# Patient Record
Sex: Female | Born: 1955 | Race: White | Hispanic: No | Marital: Married | Smoking: Former smoker
Health system: Southern US, Community
[De-identification: ages and names within clinical notes are randomized; demographics above are authoritative.]

## PROBLEM LIST (undated history)

## (undated) DIAGNOSIS — C50919 Malignant neoplasm of unspecified site of unspecified female breast: Secondary | ICD-10-CM

## (undated) DIAGNOSIS — D509 Iron deficiency anemia, unspecified: Secondary | ICD-10-CM

## (undated) DIAGNOSIS — K219 Gastro-esophageal reflux disease without esophagitis: Secondary | ICD-10-CM

## (undated) DIAGNOSIS — M898X9 Other specified disorders of bone, unspecified site: Secondary | ICD-10-CM

## (undated) DIAGNOSIS — R011 Cardiac murmur, unspecified: Secondary | ICD-10-CM

## (undated) DIAGNOSIS — M26629 Arthralgia of temporomandibular joint, unspecified side: Secondary | ICD-10-CM

## (undated) DIAGNOSIS — E039 Hypothyroidism, unspecified: Secondary | ICD-10-CM

## (undated) DIAGNOSIS — D259 Leiomyoma of uterus, unspecified: Secondary | ICD-10-CM

## (undated) DIAGNOSIS — F329 Major depressive disorder, single episode, unspecified: Secondary | ICD-10-CM

## (undated) DIAGNOSIS — F419 Anxiety disorder, unspecified: Secondary | ICD-10-CM

## (undated) DIAGNOSIS — L659 Nonscarring hair loss, unspecified: Secondary | ICD-10-CM

## (undated) DIAGNOSIS — Z9289 Personal history of other medical treatment: Secondary | ICD-10-CM

## (undated) DIAGNOSIS — K589 Irritable bowel syndrome without diarrhea: Secondary | ICD-10-CM

## (undated) DIAGNOSIS — E7800 Pure hypercholesterolemia, unspecified: Secondary | ICD-10-CM

## (undated) HISTORY — PX: TONSILLECTOMY AND ADENOIDECTOMY: SUR1326

## (undated) HISTORY — PX: ESOPHAGOGASTRODUODENOSCOPY: SHX1529

## (undated) HISTORY — DX: Gastro-esophageal reflux disease without esophagitis: K21.9

## (undated) HISTORY — DX: Iron deficiency anemia, unspecified: D50.9

## (undated) HISTORY — DX: Pure hypercholesterolemia, unspecified: E78.00

## (undated) HISTORY — DX: Irritable bowel syndrome, unspecified: K58.9

## (undated) HISTORY — DX: Cardiac murmur, unspecified: R01.1

## (undated) HISTORY — PX: OTHER SURGICAL HISTORY: SHX169

## (undated) HISTORY — DX: Arthralgia of temporomandibular joint, unspecified side: M26.629

## (undated) HISTORY — DX: Major depressive disorder, single episode, unspecified: F32.9

## (undated) HISTORY — PX: MOUTH SURGERY: SHX715

## (undated) HISTORY — PX: GYNECOLOGIC CRYOSURGERY: SHX857

## (undated) HISTORY — DX: Personal history of other medical treatment: Z92.89

## (undated) HISTORY — PX: PORTACATH PLACEMENT: SHX2246

## (undated) HISTORY — DX: Anxiety disorder, unspecified: F41.9

## (undated) HISTORY — DX: Leiomyoma of uterus, unspecified: D25.9

## (undated) HISTORY — PX: MASTECTOMY: SHX3

## (undated) HISTORY — DX: Hypothyroidism, unspecified: E03.9

## (undated) HISTORY — DX: Nonscarring hair loss, unspecified: L65.9

## (undated) HISTORY — DX: Other specified disorders of bone, unspecified site: M89.8X9

---

## 1998-04-03 ENCOUNTER — Other Ambulatory Visit: Admission: RE | Admit: 1998-04-03 | Discharge: 1998-04-03 | Payer: Self-pay | Admitting: Obstetrics and Gynecology

## 1998-07-26 ENCOUNTER — Other Ambulatory Visit: Admission: RE | Admit: 1998-07-26 | Discharge: 1998-07-26 | Payer: Self-pay | Admitting: Obstetrics and Gynecology

## 1998-10-10 ENCOUNTER — Ambulatory Visit (HOSPITAL_COMMUNITY): Admission: RE | Admit: 1998-10-10 | Discharge: 1998-10-10 | Payer: Self-pay | Admitting: Gastroenterology

## 1999-05-02 ENCOUNTER — Other Ambulatory Visit: Admission: RE | Admit: 1999-05-02 | Discharge: 1999-05-02 | Payer: Self-pay | Admitting: Obstetrics and Gynecology

## 1999-10-16 ENCOUNTER — Other Ambulatory Visit: Admission: RE | Admit: 1999-10-16 | Discharge: 1999-10-16 | Payer: Self-pay | Admitting: Obstetrics and Gynecology

## 1999-11-26 ENCOUNTER — Ambulatory Visit (HOSPITAL_BASED_OUTPATIENT_CLINIC_OR_DEPARTMENT_OTHER): Admission: RE | Admit: 1999-11-26 | Discharge: 1999-11-26 | Payer: Self-pay | Admitting: Orthopedic Surgery

## 2000-05-06 ENCOUNTER — Other Ambulatory Visit: Admission: RE | Admit: 2000-05-06 | Discharge: 2000-05-06 | Payer: Self-pay | Admitting: Obstetrics and Gynecology

## 2000-11-30 ENCOUNTER — Other Ambulatory Visit: Admission: RE | Admit: 2000-11-30 | Discharge: 2000-11-30 | Payer: Self-pay | Admitting: Obstetrics and Gynecology

## 2001-03-02 ENCOUNTER — Other Ambulatory Visit: Admission: RE | Admit: 2001-03-02 | Discharge: 2001-03-02 | Payer: Self-pay | Admitting: Obstetrics and Gynecology

## 2002-09-29 ENCOUNTER — Other Ambulatory Visit: Admission: RE | Admit: 2002-09-29 | Discharge: 2002-09-29 | Payer: Self-pay | Admitting: Obstetrics and Gynecology

## 2004-11-04 ENCOUNTER — Other Ambulatory Visit: Admission: RE | Admit: 2004-11-04 | Discharge: 2004-11-04 | Payer: Self-pay | Admitting: Obstetrics and Gynecology

## 2005-12-26 ENCOUNTER — Other Ambulatory Visit: Admission: RE | Admit: 2005-12-26 | Discharge: 2005-12-26 | Payer: Self-pay | Admitting: Obstetrics and Gynecology

## 2007-03-03 ENCOUNTER — Other Ambulatory Visit: Admission: RE | Admit: 2007-03-03 | Discharge: 2007-03-03 | Payer: Self-pay | Admitting: Obstetrics and Gynecology

## 2008-01-02 ENCOUNTER — Encounter: Admission: RE | Admit: 2008-01-02 | Discharge: 2008-01-02 | Payer: Self-pay | Admitting: Orthopedic Surgery

## 2008-01-17 ENCOUNTER — Ambulatory Visit (HOSPITAL_BASED_OUTPATIENT_CLINIC_OR_DEPARTMENT_OTHER): Admission: RE | Admit: 2008-01-17 | Discharge: 2008-01-17 | Payer: Self-pay | Admitting: Orthopedic Surgery

## 2008-01-27 ENCOUNTER — Encounter: Admission: RE | Admit: 2008-01-27 | Discharge: 2008-01-27 | Payer: Self-pay | Admitting: Orthopedic Surgery

## 2008-01-28 ENCOUNTER — Encounter: Admission: RE | Admit: 2008-01-28 | Discharge: 2008-01-28 | Payer: Self-pay | Admitting: Orthopedic Surgery

## 2009-09-16 IMAGING — CT CT CHEST W/ CM
2 of 3 series · 15 of 36 positions shown, 18 images · IV contrast (75CC OMNI 300)
Comparison: Plain radiograph from earlier today

CLINICAL DATA: Abnormal chest x-ray

CT CHEST WITH CONTRAST
TECHNIQUE: Multidetector CT imaging of the chest was performed
following the standard protocol during bolus administration of
intravenous contrast.
Contrast: 75 ml Tmnipaque-DQQ

[Series 2: routine chest · axial · 0.70mm/px · z∈[-280,+5]mm · 12 of 67 slices shown, 15 images]
[im 5/67  mediastinal]
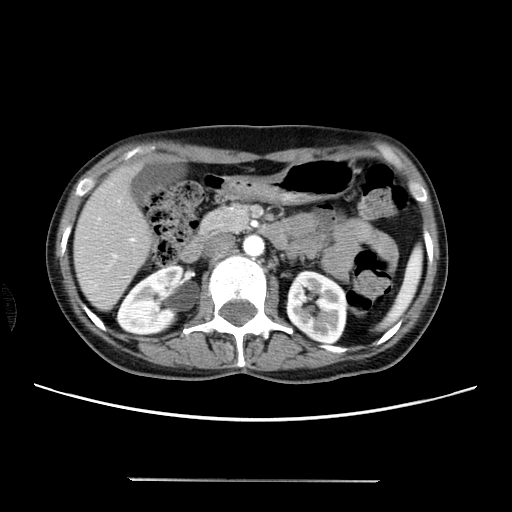
[im 5/67  lung]
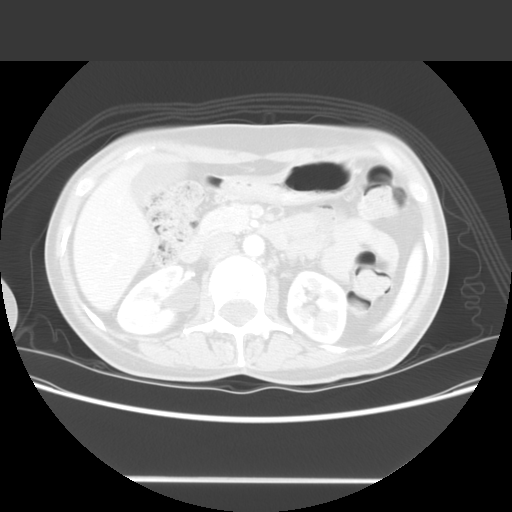
[im 10/67  lung]
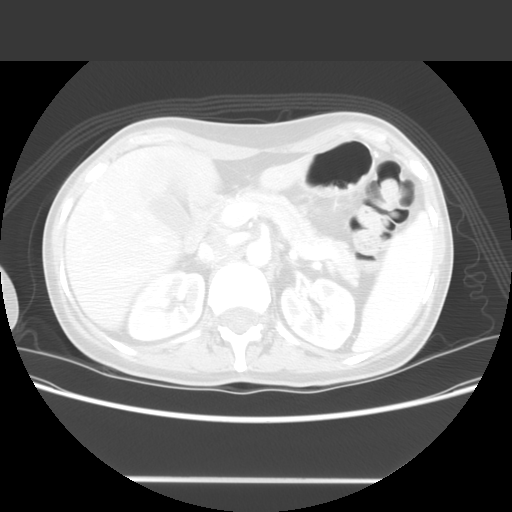
[im 15/67  lung]
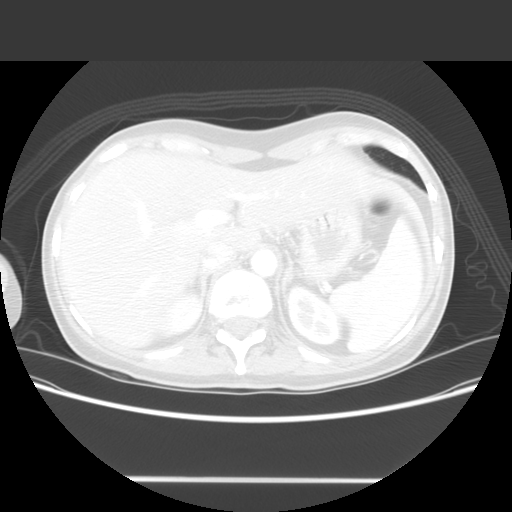
[im 20/67  lung]
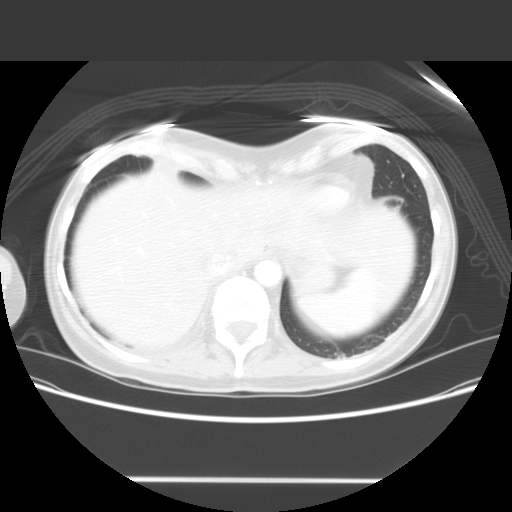
[im 25/67  mediastinal]
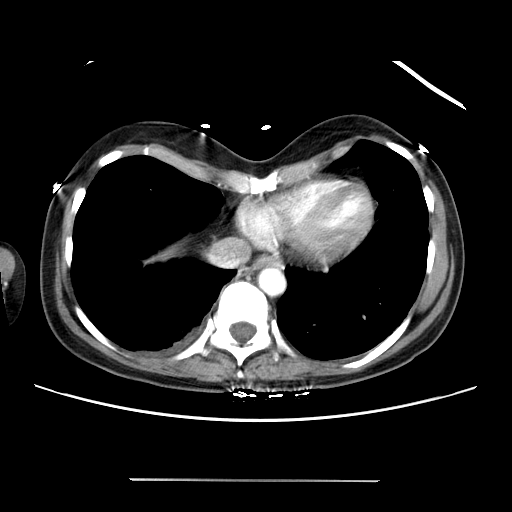
[im 25/67  lung]
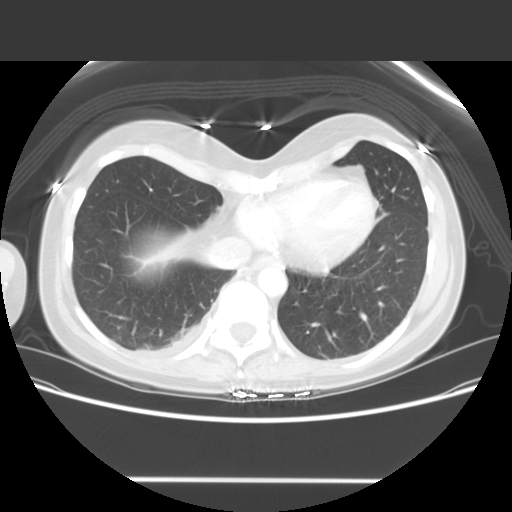
[im 30/67  lung]
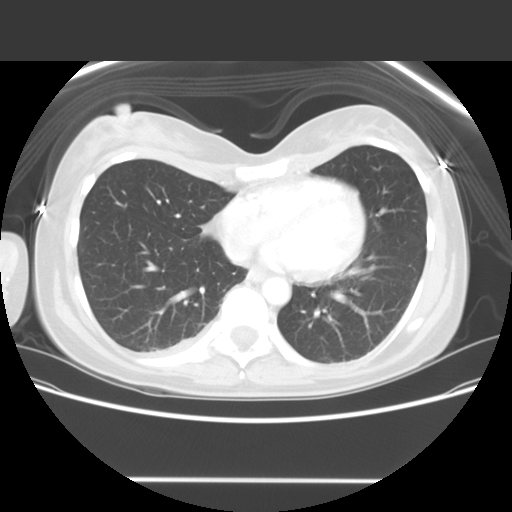
[im 37/67  lung]
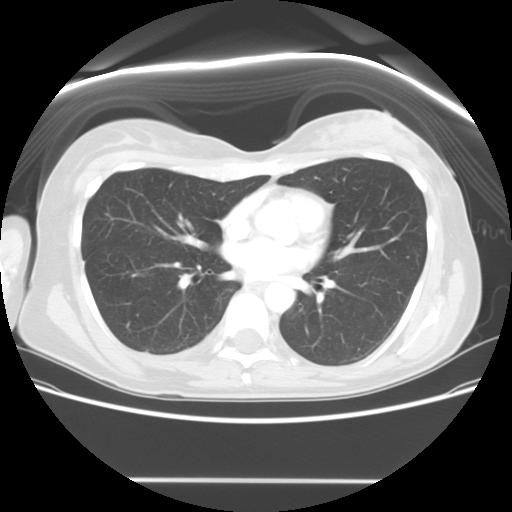
[im 42/67  lung]
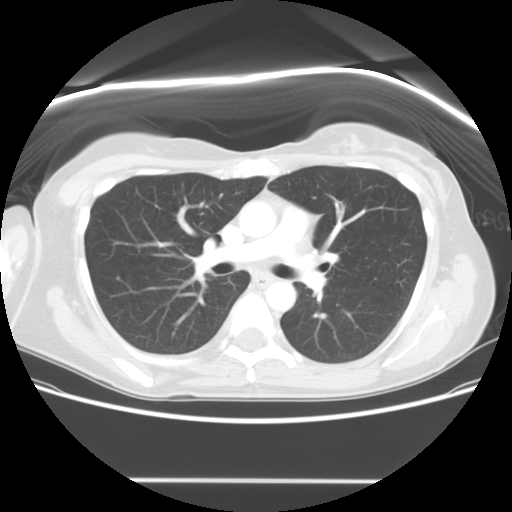
[im 47/67  mediastinal]
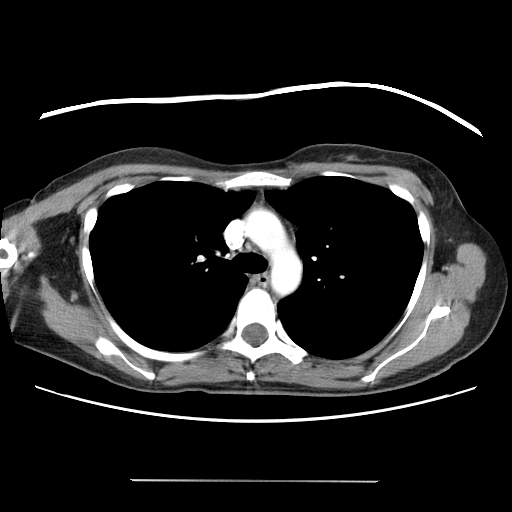
[im 47/67  lung]
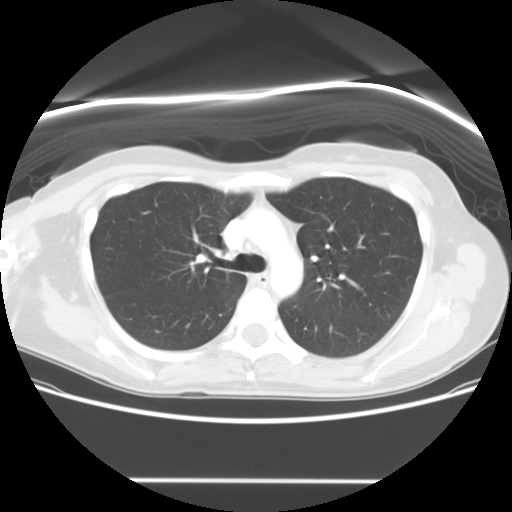
[im 52/67  lung]
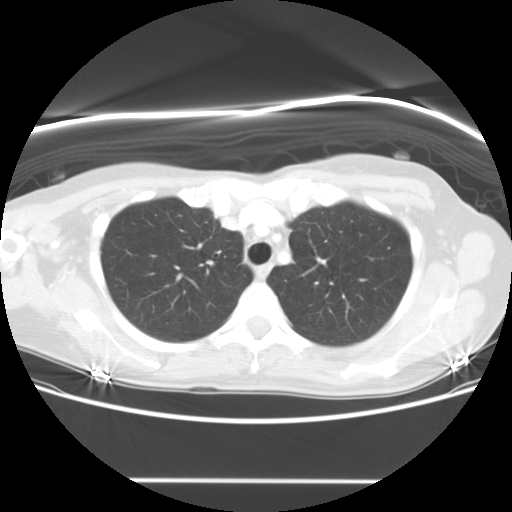
[im 57/67  lung]
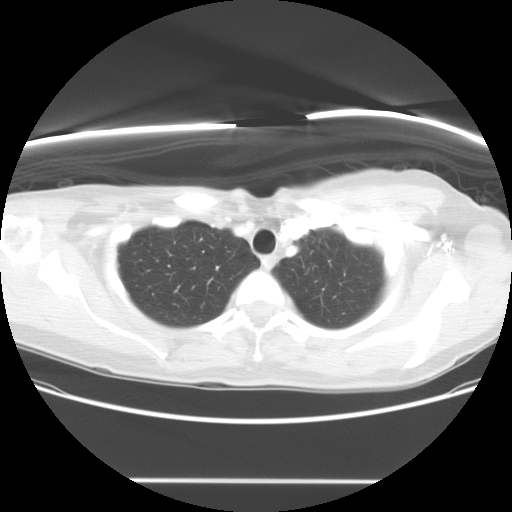
[im 62/67  lung]
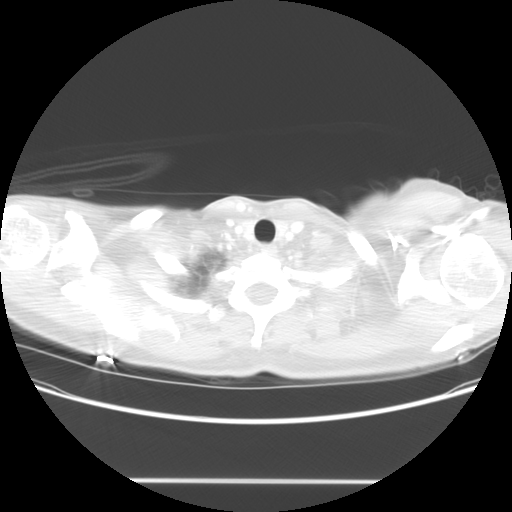

[Series 401: sagittal · coronal · 0.70mm/px · 3 of 86 slices shown]
[im 18/86  lung]
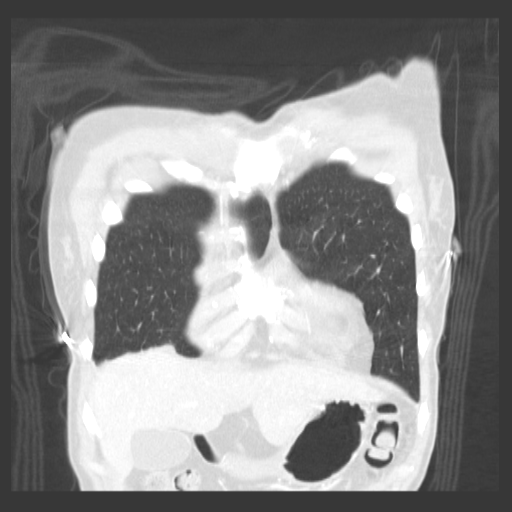
[im 35/86  lung]
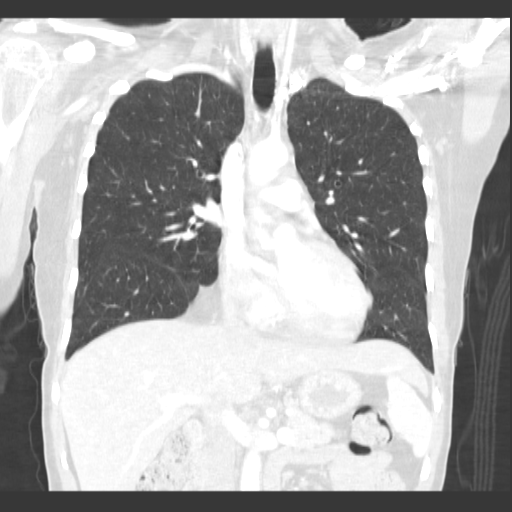
[im 52/86  lung]
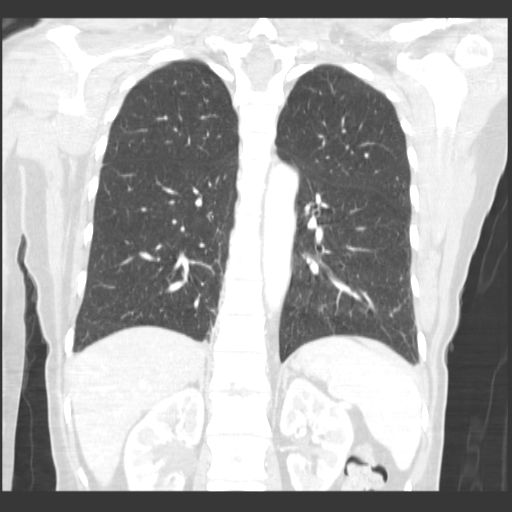

[15 of 36 positions shown; findings below may reference images not displayed]

FINDINGS: No abnormal pulmonary nodules are identified.  A
prominent nipple accounts for the radiographic abnormality.

There is a 3 mm pulmonary nodule in the right upper lobe on image
26.  Lungs are otherwise clear.

A small right pleural effusion is present.

Negative abnormal mediastinal adenopathy or pericardial effusion.
Pectus deformity is noted. Multiple hypodensities in both kidneys
are noted likely cysts.
IMPRESSION: No pulmonary nodule is identified.  Prominent nipple account for
the radiographic abnormality.

Small right pleural effusion.

3 mm right upper lobe pulmonary nodule.  A low risk patient, no
further workup is necessary.  In a high risk patient, follow-up CT
at 12 months is recommended. This recommendation follows the
consensus statement: "Guidelines for Management of Small Pulmonary
Nodules Detected on CT Scans:  A Statement from the [facility]
online at:  [URL]

## 2009-09-16 IMAGING — CR DG CHEST 2V
2 series · 2 of 2 positions shown · non-contrast
Comparison: None

CLINICAL DATA: Pneumonia

CHEST - 2 VIEW

[view not recorded (1 of 2)]
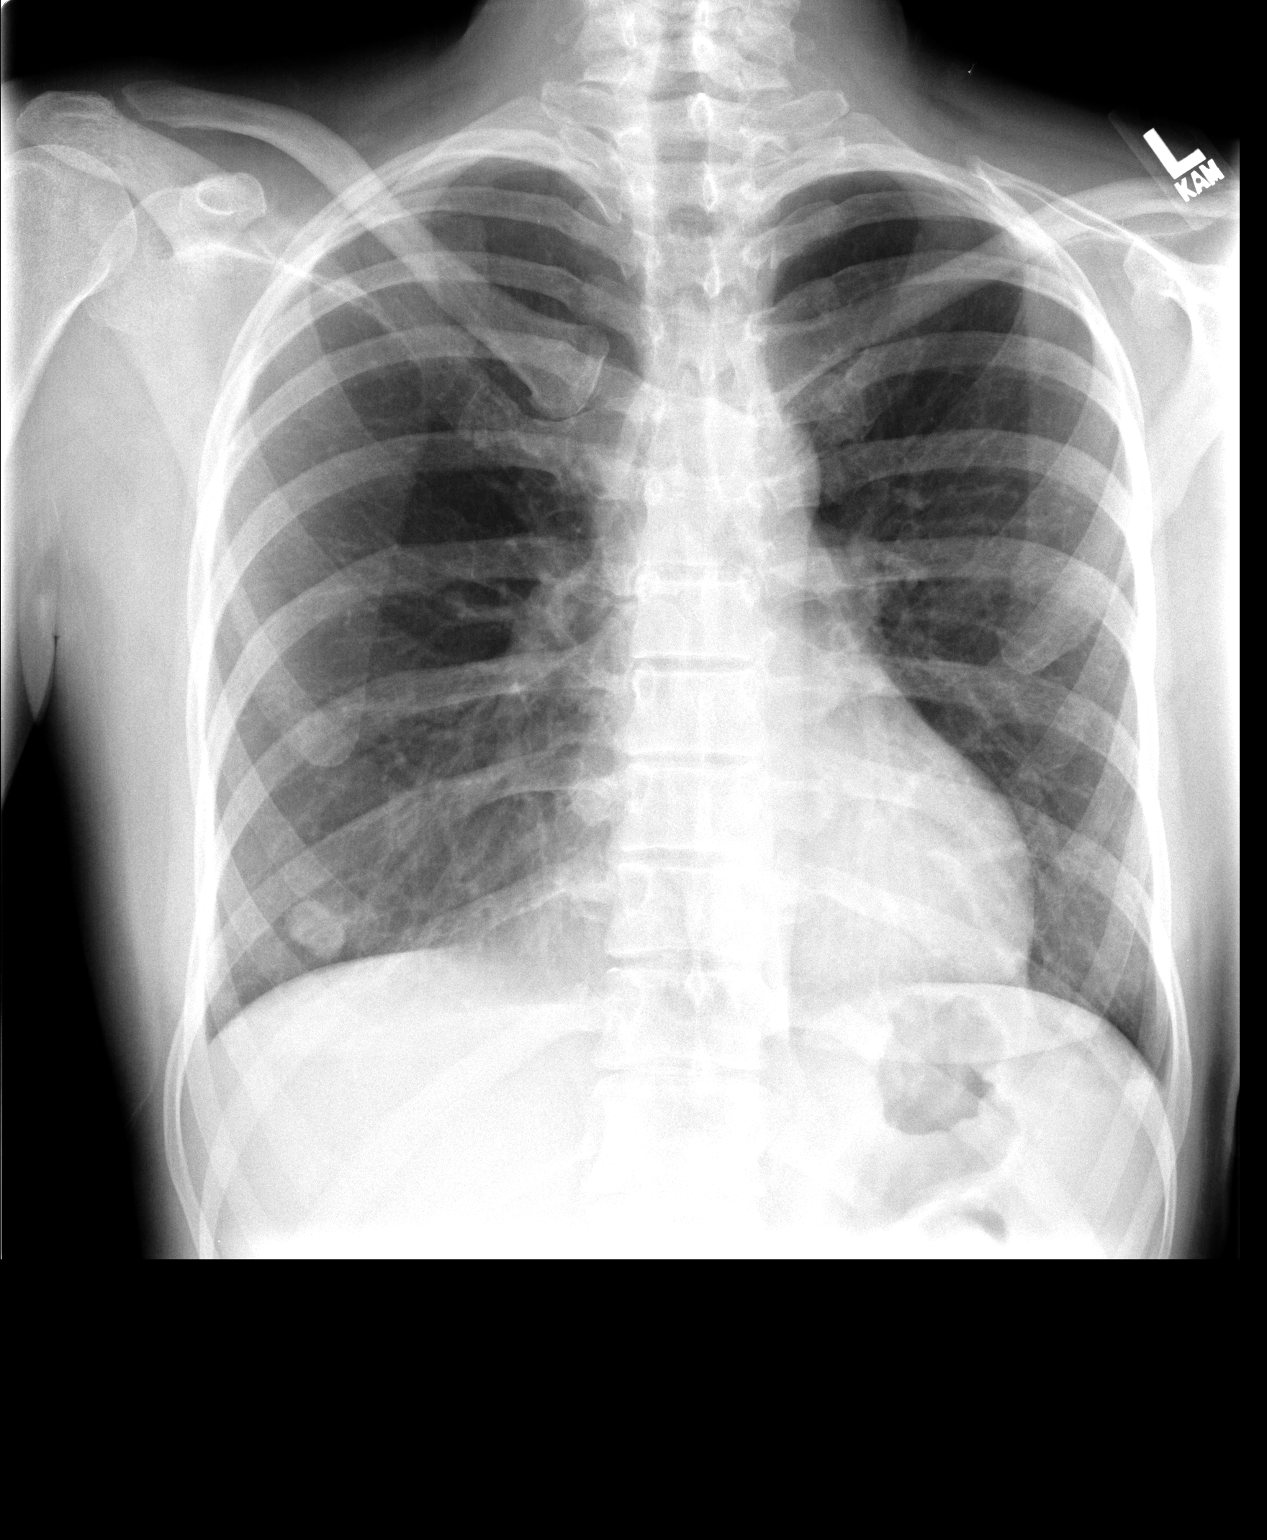

[view not recorded (2 of 2)]
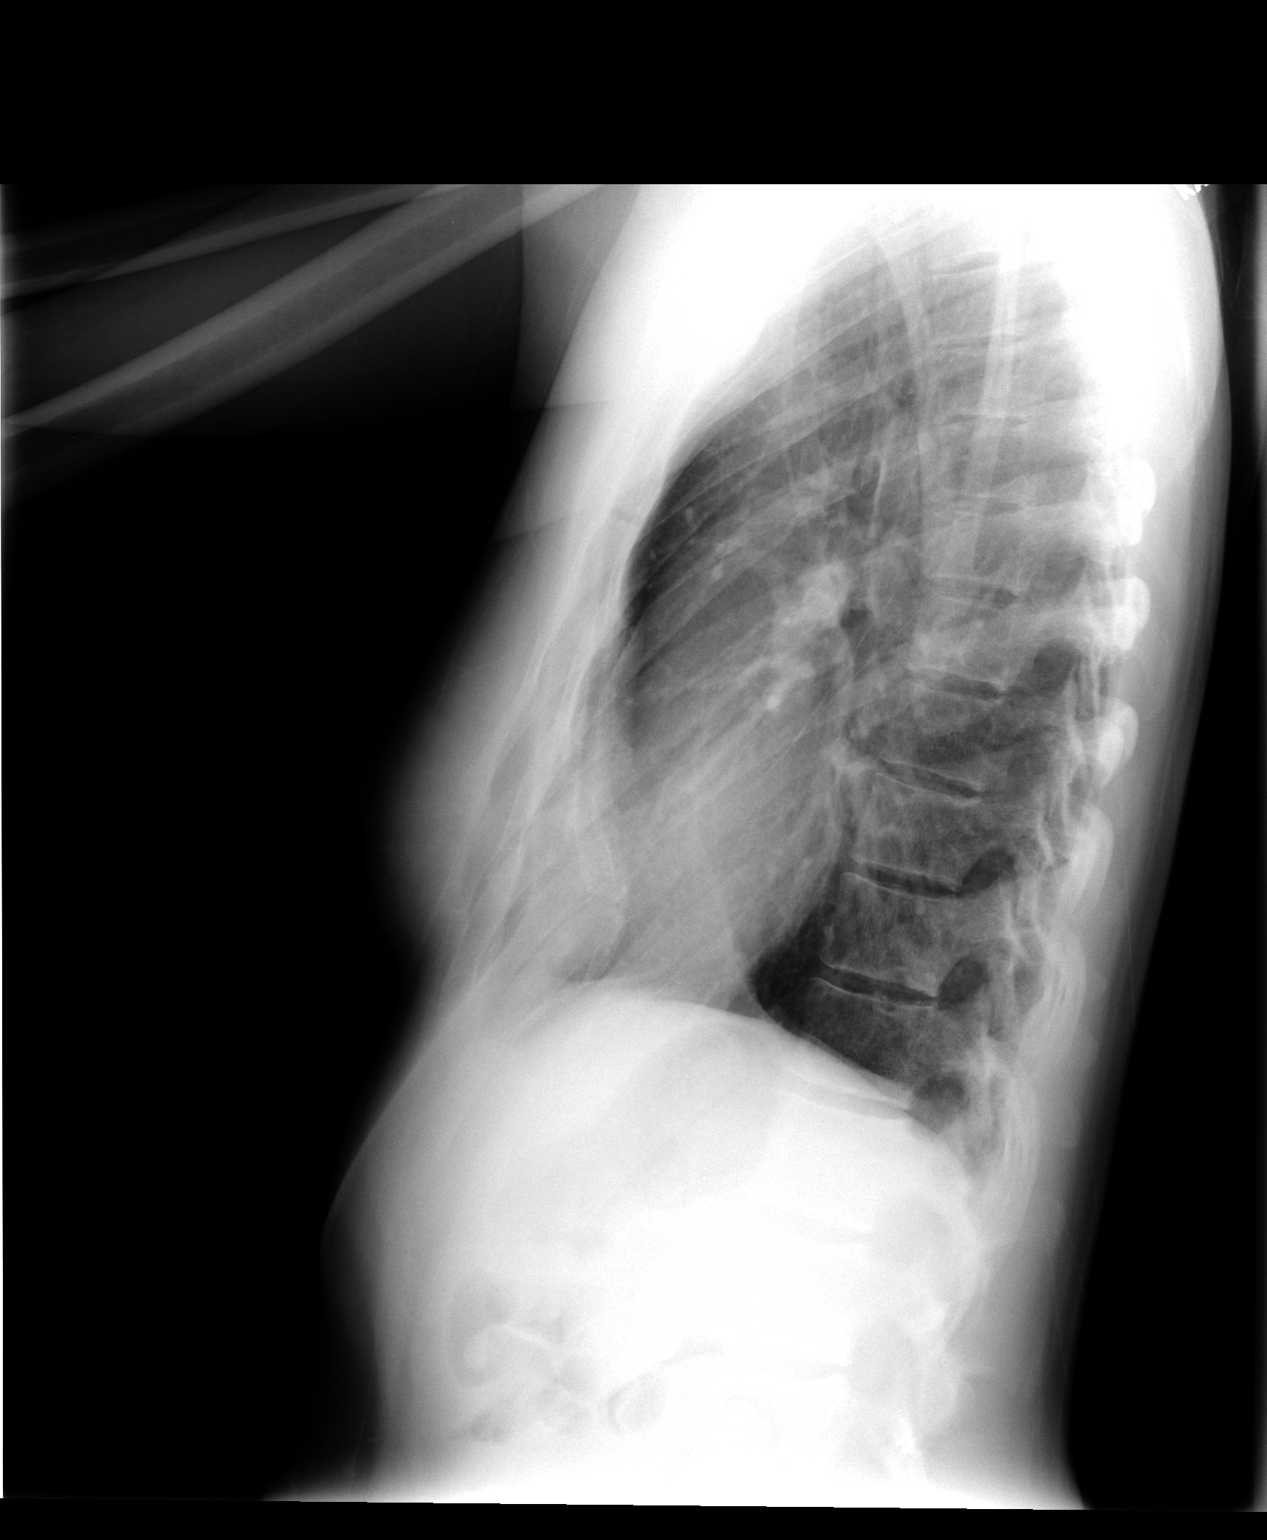

[2 of 2 positions shown; findings below may reference images not displayed]

FINDINGS: There is a dense 1.5 cm nodule at the right lung base.
Lungs are otherwise clear.  No pneumothorax or effusion.  Mild
pectus excavatum no pneumothorax or effusion.
IMPRESSION: Dense nodule at the right lung base.  CT is recommended to further
characterize.

## 2009-10-02 ENCOUNTER — Emergency Department (HOSPITAL_COMMUNITY): Admission: EM | Admit: 2009-10-02 | Discharge: 2009-10-04 | Payer: Self-pay | Admitting: Emergency Medicine

## 2009-12-24 ENCOUNTER — Ambulatory Visit: Payer: Self-pay | Admitting: Psychiatry

## 2010-05-10 LAB — CBC
Hemoglobin: 13.1 g/dL (ref 12.0–15.0)
MCH: 28.2 pg (ref 26.0–34.0)
MCHC: 33.4 g/dL (ref 30.0–36.0)
Platelets: 340 10*3/uL (ref 150–400)
RDW: 16 % — ABNORMAL HIGH (ref 11.5–15.5)

## 2010-05-10 LAB — URINALYSIS, ROUTINE W REFLEX MICROSCOPIC
Ketones, ur: NEGATIVE mg/dL
Nitrite: NEGATIVE
Urobilinogen, UA: 0.2 mg/dL (ref 0.0–1.0)
pH: 6.5 (ref 5.0–8.0)

## 2010-05-10 LAB — BASIC METABOLIC PANEL
BUN: 7 mg/dL (ref 6–23)
CO2: 27 mEq/L (ref 19–32)
Calcium: 9.8 mg/dL (ref 8.4–10.5)
Creatinine, Ser: 0.82 mg/dL (ref 0.4–1.2)
GFR calc non Af Amer: >60 mL/min (ref >60)
Glucose, Bld: 117 mg/dL — ABNORMAL HIGH (ref 70–99)
Sodium: 141 mEq/L (ref 135–145)

## 2010-05-10 LAB — DIFFERENTIAL
Basophils Absolute: 0 10*3/uL (ref 0.0–0.1)
Basophils Relative: 0 % (ref 0–1)
Eosinophils Absolute: 0 10*3/uL (ref 0.0–0.7)
Neutro Abs: 5.8 10*3/uL (ref 1.7–7.7)
Neutrophils Relative %: 75 % (ref 43–77)

## 2010-05-10 LAB — RAPID URINE DRUG SCREEN, HOSP PERFORMED
Amphetamines: NOT DETECTED
Barbiturates: NOT DETECTED

## 2010-05-10 LAB — PREGNANCY, URINE: Preg Test, Ur: NEGATIVE

## 2010-05-10 LAB — TRICYCLICS SCREEN, URINE: TCA Scrn: NOT DETECTED

## 2010-05-14 ENCOUNTER — Other Ambulatory Visit: Payer: Self-pay | Admitting: Unknown Physician Specialty

## 2010-05-14 DIAGNOSIS — Z1231 Encounter for screening mammogram for malignant neoplasm of breast: Secondary | ICD-10-CM

## 2010-05-14 DIAGNOSIS — Z780 Asymptomatic menopausal state: Secondary | ICD-10-CM

## 2010-05-24 ENCOUNTER — Ambulatory Visit
Admission: RE | Admit: 2010-05-24 | Discharge: 2010-05-24 | Disposition: A | Discharge status: Home / Self Care | Payer: BC Managed Care – PPO | Source: Ambulatory Visit | Attending: Unknown Physician Specialty | Admitting: Unknown Physician Specialty

## 2010-05-24 DIAGNOSIS — Z780 Asymptomatic menopausal state: Secondary | ICD-10-CM

## 2010-05-24 DIAGNOSIS — Z1231 Encounter for screening mammogram for malignant neoplasm of breast: Secondary | ICD-10-CM

## 2010-05-27 ENCOUNTER — Other Ambulatory Visit: Payer: Self-pay | Admitting: Unknown Physician Specialty

## 2010-05-27 DIAGNOSIS — N6459 Other signs and symptoms in breast: Secondary | ICD-10-CM

## 2010-07-09 NOTE — Op Note (Signed)
[name], [name]               ACCOUNT NO.:  [id number]   MEDICAL RECORD NO.:  [id number]          PATIENT TYPE:  AMB   LOCATION:  DSC                          FACILITY:  [facility]   PHYSICIAN:  Robert A. Thurston Hole, M.D. DATE OF BIRTH:  07-23-1955   DATE OF PROCEDURE:  01/17/2008  DATE OF DISCHARGE:                               OPERATIVE REPORT   PREOPERATIVE DIAGNOSES:  1. Right shoulder adhesive capsulitis.  2. Right shoulder partial rotator cuff tear.  3. Right shoulder impingement.  4. Right shoulder acromioclavicular joint degenerative joint disease      and spurring.   POSTOPERATIVE DIAGNOSES:  1. Right shoulder adhesive capsulitis.  2. Right shoulder partial rotator cuff tear.  3. Right shoulder impingement.  4. Right shoulder acromioclavicular joint degenerative joint disease      and spurring.   PROCEDURES:  1. Right shoulder examination under anesthesia followed by      manipulation.  2. Right shoulder arthroscopic lysis of adhesions.  3. Right shoulder arthroscopic debridement of partial rotator cuff      tear with subacromial decompression.  4. Right shoulder distal clavicle excision.   SURGEON:  Elana Alm. Thurston Hole, MD   ASSISTANT:  Julien Girt, PA   ANESTHESIA:  General.   OPERATIVE TIME:  45 minutes.   COMPLICATIONS:  None.   INDICATIONS FOR PROCEDURE:  Laurie [name] is a 55 year old who has had  significant pain and decreasing range of motion in her right shoulder  for the past 3-4 months, increasing in nature.  Exam and MRIs were  consistent with rotator cuff partial tear with adhesive capsulitis and  impingement and AC joint arthropathy.  She has failed conservative care  and is now to undergo EUA, manipulation, arthroscopic lysis of  adhesions, and debridement.   DESCRIPTION:  Laurie [name] was brought to the operating room on January 17, 2008, after interscalene block was placed in the holding by  Anesthesia.  She was placed on the operating  table in supine position.  The right shoulder was examined under anesthesia.  Initial range of  motion showed forward flexion of 90, abduction of 90, and internal and  external rotation of 50 degrees.  A gentle manipulation was carried out  breaking up adhesions and improving forward flexion of 175, abduction of  175, and internal and external rotation of 85 degrees.  The shoulder  remained stable to ligamentous exam.  She was then placed on beach chair  position, and her shoulder and arm were prepped using sterile DuraPrep  and draped using sterile technique.  Originally, the arthroscopy was  performed to a posterior arthroscopic portal.  The arthroscope with a  pump attached was placed into an anterior portal.  Arthroscopic probe  was placed.  Moderate hemarthrosis secondary to the manipulation was  evacuated.  The articular surfaces on the glenoid and humeral head were  intact.  The anterior, superior, and posterior labrum were all intact,  but were significantly erythematous secondary to erythematous synovitis.  This was partially debrided and cauterized.  The anteroinferior  glenohumeral ligament complex was intact.  Biceps tendon and  anchor of  biceps tendon was intact.  Rotator cuff showed a small partial tear of  the supraspinatus 20%, which was debrided.  The rest of the rotator cuff  was intact.  The inferior cuff was reassessed, showed moderately  thickened synovitis, which was partially debrided and cauterized, but  was otherwise free of pathology.  Subacromial space was then entered and  lateral arthroscopic portal was made.  Large amount of bursitis  inflamed, was thoroughly resected and cauterized.  The rotator cuff was  somewhat frayed on the bursal surface, but no evidence of a tear.  Impingement was noted and the subacromial decompression was carried out  removing 6 mm of under surface of the anterior, anterolateral,  anteromedial, acromion, and CA ligament release  carried down as well.  The Laurie [name] joint showed significant spurring and degenerative changes and  the distal 6-mm clavicle was resected with a 6-mm bur.  After this was  done, the shoulder could be brought through a full range of motion with  no impingement on the rotator cuff.  At this point, it was felt that all  pathology have been satisfactorily addressed.  The instruments were  removed.  The portals were closed with 3-0 nylon suture.  The shoulder  was then injected with 80 mg of Depo-Medrol and 10 mL of 0.25% Marcaine  with epinephrine with half of this being put in the joint and half in  the subacromial space.  Sterile dressings and a sling were then applied.  The patient awakened and taken to the recovery room in stable condition.   FOLLOWUP CARE:  Laurie [name] will be followed as an outpatient on Nucynta  and Robaxin with early aggressive physical therapy.  She will be seen  back in the office in a week for sutures out and followup.      Robert A. Thurston Hole, M.D.  Electronically Signed     RAW/MEDQ  D:  01/17/2008  T:  01/18/2008  Job:  [id number]

## 2010-07-12 NOTE — Op Note (Signed)
[place]  Patient:    Laurie [name], Laurie [name]                      MRN: [id number] Proc. Date: 11/26/99 Adm. Date:  [id number] Disc. Date: [id number] Attending:  Twana First                           Operative Report  PREOPERATIVE DIAGNOSIS:  Left shoulder adhesive capsulitis and impingement with rotator cuff tendinitis.  POSTOPERATIVE DIAGNOSIS:  Left shoulder adhesive capsulitis and impingement with rotator cuff tendinitis.  PROCEDURE:  Left shoulder examination under anesthesia, followed by manipulation and arthroscopic lysis of adhesions with subacromial decompression.  SURGEON:  Elana Alm. Thurston Hole, M.D.  ASSISTANT:  Kirstin A. Shepperson, P.A.  ANESTHESIA:  General.  OPERATIVE TIME:  Forty minutes.  COMPLICATIONS:  None.  INDICATION FOR PROCEDURE:  Laurie [name] is a 55 year old woman who has had significant pain in Laurie [name] left shoulder over the past four to five months, with decreased range of motion and increased pain, that has failed conservative care and is now to undergo EUA, manipulation and arthroscopy.  DESCRIPTION OF PROCEDURE:  Laurie [name] was brought to the operating room on November 26, 1999 after a supraclavicular block had been placed in the holding room and placed on the operating table in supine position.  After an adequate level of general anesthesia was obtained, Laurie [name] left shoulder was examined under anesthesia.  Initial exam shows forward flexion of 90 degrees, abduction of 80 degrees and internal and external rotation of 50 degrees.  A gentle manipulation was carried out, breaking up soft adhesions and improving forward flexion to 180, abduction to 170, internal and external rotation of 90 degrees.  The shoulder remained stable to ligamentous exam.  After this was done, Laurie [name] shoulder and arm were prepped using sterile Betadine and draped using a sterile technique.  Originally, through a posterior arthroscopic portal, the  arthroscope with a pump attachment was placed and through an anterior portal, an arthroscopic probe was placed.  On initial inspection, she was found to have moderate hemorrhagic synovitis consistent with Laurie [name] adhesive capsulitis.  The anterior glenohumeral ligament complex and the anteroinferior labrum were intact.  The middle anterior labrum, superior labrum and biceps tendon anchor were intact.  The biceps tendon was intact.  The posterior labrum was intact.  Articular cartilage and the glenohumeral joint were intact.  Rotator cuff was thoroughly inspected but there was found to be no evidence of a tear.  Moderate hemorrhagic synovitis in the inferior capsular space was noted and partially debrided.  At this point, a lateral arthroscopic portal made and the subacromial space was entered.  Moderately thickened bursitis was resected.  Underneath this, the rotator cuff was found to be thickened and edematous but no evidence of a tear.  Subacromial decompression was carried out, removing 6 to 8 mm of the undersurface of the anterior, anterolateral and anteromedial acromion, and CA ligament release carried out. The [facility] joint was not disturbed.  After this was done, the shoulder could be brought through a full range of motion without impingement on the rotator cuff.  At this point, it was felt that all pathology had been satisfactorily addressed.  The arthroscopic instruments were removed.  Portals were closed with 3-0 nylon suture and injected with 0.25% Marcaine with epinephrine, sterile dressings applied and the patient awakened and taken to the recovery room in stable condition.  FOLLOWUP CARE:  Laurie [name] will be followed as an outpatient, on Percocet and Vioxx, early aggressive physical therapy for range of motion, followed by strenghtening.  I will see Laurie [name] back in the office in a week for sutures out and followup. DD:  01/07/00 TD:  01/07/00 Job: [id number]

## 2010-11-27 LAB — POCT HEMOGLOBIN-HEMACUE: Hemoglobin: 11.7 — ABNORMAL LOW

## 2014-08-01 ENCOUNTER — Emergency Department (HOSPITAL_COMMUNITY)
Admission: EM | Admit: 2014-08-01 | Discharge: 2014-08-01 | Disposition: A | Discharge status: Home / Self Care | Payer: 59 | Attending: Emergency Medicine | Admitting: Emergency Medicine

## 2014-08-01 ENCOUNTER — Encounter (HOSPITAL_COMMUNITY): Payer: Self-pay | Admitting: Physical Medicine and Rehabilitation

## 2014-08-01 DIAGNOSIS — R112 Nausea with vomiting, unspecified: Secondary | ICD-10-CM | POA: Insufficient documentation

## 2014-08-01 DIAGNOSIS — N61 Inflammatory disorders of breast: Secondary | ICD-10-CM | POA: Insufficient documentation

## 2014-08-01 DIAGNOSIS — R109 Unspecified abdominal pain: Secondary | ICD-10-CM | POA: Diagnosis present

## 2014-08-01 DIAGNOSIS — R197 Diarrhea, unspecified: Secondary | ICD-10-CM | POA: Insufficient documentation

## 2014-08-01 LAB — COMPREHENSIVE METABOLIC PANEL
ALT: 16 U/L (ref 14–54)
ANION GAP: 10 (ref 5–15)
AST: 21 U/L (ref 15–41)
Albumin: 4.3 g/dL (ref 3.5–5.0)
Alkaline Phosphatase: 64 U/L (ref 38–126)
BILIRUBIN TOTAL: 0.3 mg/dL (ref 0.3–1.2)
BUN: 5 mg/dL — AB (ref 6–20)
CO2: 26 mmol/L (ref 22–32)
CREATININE: 0.88 mg/dL (ref 0.44–1.00)
Calcium: 9.9 mg/dL (ref 8.9–10.3)
Chloride: 106 mmol/L (ref 101–111)
GFR calc Af Amer: >60 mL/min (ref >60)
GFR calc non Af Amer: >60 mL/min (ref >60)
GLUCOSE: 106 mg/dL — AB (ref 65–99)
POTASSIUM: 3.5 mmol/L (ref 3.5–5.1)
SODIUM: 142 mmol/L (ref 135–145)
TOTAL PROTEIN: 7.5 g/dL (ref 6.5–8.1)

## 2014-08-01 LAB — CBC WITH DIFFERENTIAL/PLATELET
Basophils Absolute: 0 10*3/uL (ref 0.0–0.1)
Basophils Relative: 0 % (ref 0–1)
Eosinophils Absolute: 0 10*3/uL (ref 0.0–0.7)
Eosinophils Relative: 1 % (ref 0–5)
HEMATOCRIT: 37 % (ref 36.0–46.0)
HEMOGLOBIN: 11.9 g/dL — AB (ref 12.0–15.0)
Lymphocytes Relative: 18 % (ref 12–46)
Lymphs Abs: 1.1 10*3/uL (ref 0.7–4.0)
MCH: 26.2 pg (ref 26.0–34.0)
MCHC: 32.2 g/dL (ref 30.0–36.0)
MCV: 81.3 fL (ref 78.0–100.0)
MONO ABS: 0.3 10*3/uL (ref 0.1–1.0)
Monocytes Relative: 5 % (ref 3–12)
NEUTROS PCT: 76 % (ref 43–77)
Neutro Abs: 4.7 10*3/uL (ref 1.7–7.7)
Platelets: 341 10*3/uL (ref 150–400)
RBC: 4.55 MIL/uL (ref 3.87–5.11)
RDW: 15.7 % — ABNORMAL HIGH (ref 11.5–15.5)
WBC: 6.2 10*3/uL (ref 4.0–10.5)

## 2014-08-01 LAB — LIPASE, BLOOD: LIPASE: 29 U/L (ref 22–51)

## 2014-08-01 LAB — TSH: TSH: 0.484 u[IU]/mL (ref 0.350–4.500)

## 2014-08-01 MED ORDER — SODIUM CHLORIDE 0.9 % IV BOLUS (SEPSIS)
1000.0000 mL | Freq: Once | INTRAVENOUS | Status: AC
  Administered 2014-08-01: 1000 mL via INTRAVENOUS

## 2014-08-01 MED ORDER — CEPHALEXIN 500 MG PO CAPS: 500.0000 mg | ORAL_CAPSULE | Freq: Four times a day (QID) | ORAL | Status: DC

## 2014-08-01 NOTE — ED Notes (Signed)
Pt presents to department for evaluation of abdominal pain, throat swelling and diarrhea. Ongoing for several days. 5/10 abdominal pain at the time. Pt is alert and oriented x4.

## 2014-08-01 NOTE — Discharge Instructions (Signed)
Cellulitis Cellulitis is an infection of the skin and the tissue beneath it. The infected area is usually red and tender. Cellulitis occurs most often in the arms and lower legs.  CAUSES  Cellulitis is caused by bacteria that enter the skin through cracks or cuts in the skin. The most common types of bacteria that cause cellulitis are staphylococci and streptococci. SIGNS AND SYMPTOMS   Redness and warmth.  Swelling.  Tenderness or pain.  Fever. DIAGNOSIS  Your health care provider can usually determine what is wrong based on a physical exam. Blood tests may also be done. TREATMENT  Treatment usually involves taking an antibiotic medicine. HOME CARE INSTRUCTIONS   Take your antibiotic medicine as directed by your health care provider. Finish the antibiotic even if you start to feel better.  Keep the infected arm or leg elevated to reduce swelling.  Apply a warm cloth to the affected area up to 4 times per day to relieve pain.  Take medicines only as directed by your health care provider.  Keep all follow-up visits as directed by your health care provider. SEEK MEDICAL CARE IF:   You notice red streaks coming from the infected area.  Your red area gets larger or turns dark in color.  Your bone or joint underneath the infected area becomes painful after the skin has healed.  Your infection returns in the same area or another area.  You notice a swollen bump in the infected area.  You develop new symptoms.  You have a fever. SEEK IMMEDIATE MEDICAL CARE IF:   You feel very sleepy.  You develop vomiting or diarrhea.  You have a general ill feeling (malaise) with muscle aches and pains. MAKE SURE YOU:   Understand these instructions.  Will watch your condition.  Will get help right away if you are not doing well or get worse. Document Released: 11/20/2004 Document Revised: 06/27/2013 Document Reviewed: 04/28/2011 [facility] Patient Information 2015 [place], [place].  This information is not intended to replace advice given to you by your health care provider. Make sure you discuss any questions you have with your health care provider. Abdominal Pain Many things can cause abdominal pain. Usually, abdominal pain is not caused by a disease and will improve without treatment. It can often be observed and treated at home. Your health care provider will do a physical exam and possibly order blood tests and X-rays to help determine the seriousness of your pain. However, in many cases, more time must pass before a clear cause of the pain can be found. Before that point, your health care provider may not know if you need more testing or further treatment. HOME CARE INSTRUCTIONS  Monitor your abdominal pain for any changes. The following actions may help to alleviate any discomfort you are experiencing:  Only take over-the-counter or prescription medicines as directed by your health care provider.  Do not take laxatives unless directed to do so by your health care provider.  Try a clear liquid diet (broth, tea, or water) as directed by your health care provider. Slowly move to a bland diet as tolerated. SEEK MEDICAL CARE IF:  You have unexplained abdominal pain.  You have abdominal pain associated with nausea or diarrhea.  You have pain when you urinate or have a bowel movement.  You experience abdominal pain that wakes you in the night.  You have abdominal pain that is worsened or improved by eating food.  You have abdominal pain that is worsened with eating fatty  foods.  You have a fever. SEEK IMMEDIATE MEDICAL CARE IF:   Your pain does not go away within 2 hours.  You keep throwing up (vomiting).  Your pain is felt only in portions of the abdomen, such as the right side or the left lower portion of the abdomen.  You pass bloody or black tarry stools. MAKE SURE YOU:  Understand these instructions.   Will watch your condition.   Will get help  right away if you are not doing well or get worse.  Document Released: 11/20/2004 Document Revised: 02/15/2013 Document Reviewed: 10/20/2012 [facility] Patient Information 2015 [place], [place]. This information is not intended to replace advice given to you by your health care provider. Make sure you discuss any questions you have with your health care provider. Diarrhea Diarrhea is frequent loose and watery bowel movements. It can cause you to feel weak and dehydrated. Dehydration can cause you to become tired and thirsty, have a dry mouth, and have decreased urination that often is dark yellow. Diarrhea is a sign of another problem, most often an infection that will not last long. In most cases, diarrhea typically lasts 2-3 days. However, it can last longer if it is a sign of something more serious. It is important to treat your diarrhea as directed by your caregiver to lessen or prevent future episodes of diarrhea. CAUSES  Some common causes include:  Gastrointestinal infections caused by viruses, bacteria, or parasites.  Food poisoning or food allergies.  Certain medicines, such as antibiotics, chemotherapy, and laxatives.  Artificial sweeteners and fructose.  Digestive disorders. HOME CARE INSTRUCTIONS  Ensure adequate fluid intake (hydration): Have 1 cup (8 oz) of fluid for each diarrhea episode. Avoid fluids that contain simple sugars or sports drinks, fruit juices, whole milk products, and sodas. Your urine should be clear or pale yellow if you are drinking enough fluids. Hydrate with an oral rehydration solution that you can purchase at pharmacies, retail stores, and online. You can prepare an oral rehydration solution at home by mixing the following ingredients together:   - tsp table salt.   tsp baking soda.   tsp salt substitute containing potassium chloride.  1  tablespoons sugar.  1 L (34 oz) of water.  Certain foods and beverages may increase the speed at which food  moves through the gastrointestinal (GI) tract. These foods and beverages should be avoided and include:  Caffeinated and alcoholic beverages.  High-fiber foods, such as raw fruits and vegetables, nuts, seeds, and whole grain breads and cereals.  Foods and beverages sweetened with sugar alcohols, such as xylitol, sorbitol, and mannitol.  Some foods may be well tolerated and may help thicken stool including:  Starchy foods, such as rice, toast, pasta, low-sugar cereal, oatmeal, grits, baked potatoes, crackers, and bagels.  Bananas.  Applesauce.  Add probiotic-rich foods to help increase healthy bacteria in the GI tract, such as yogurt and fermented milk products.  Wash your hands well after each diarrhea episode.  Only take over-the-counter or prescription medicines as directed by your caregiver.  Take a warm bath to relieve any burning or pain from frequent diarrhea episodes. SEEK IMMEDIATE MEDICAL CARE IF:   You are unable to keep fluids down.  You have persistent vomiting.  You have blood in your stool, or your stools are black and tarry.  You do not urinate in 6-8 hours, or there is only a small amount of very dark urine.  You have abdominal pain that increases or localizes.  You  have weakness, dizziness, confusion, or light-headedness.  You have a severe headache.  Your diarrhea gets worse or does not get better.  You have a fever or persistent symptoms for more than 2-3 days.  You have a fever and your symptoms suddenly get worse. MAKE SURE YOU:   Understand these instructions.  Will watch your condition.  Will get help right away if you are not doing well or get worse. Document Released: 01/31/2002 Document Revised: 06/27/2013 Document Reviewed: 10/19/2011 [facility] Patient Information 2015 [place], [place]. This information is not intended to replace advice given to you by your health care provider. Make sure you discuss any questions you have with your health  care provider.

## 2014-08-01 NOTE — ED Provider Notes (Signed)
CSN: [id number]     Arrival date & time 08/01/14  1046 History   First MD Initiated Contact with Patient 08/01/14 1319     Chief Complaint  Patient presents with  . Abdominal Pain  . Diarrhea     (Consider location/radiation/quality/duration/timing/severity/associated sxs/prior Treatment) HPI Comments: Patient presents to the emergency department with chief complaints of nausea, vomiting, diarrhea, and abdominal cramping 5 days. She states that she cannot keep anything down. She has not tried taking anything to alleviate her symptoms. She also states that it feels like her "thyroid is swollen." States that she has had thyroiditis, and that this feels similar. She denies fevers, chills, chest pain, shortness of breath. There are no aggravating or alleviating factors.  The history is provided by the patient. No language interpreter was used.    History reviewed. No pertinent past medical history. History reviewed. No pertinent past surgical history. No family history on file. History  Substance Use Topics  . Smoking status: Never Smoker   . Smokeless tobacco: Not on file  . Alcohol Use: No   OB History    No data available     Review of Systems  Constitutional: Negative for fever and chills.  Respiratory: Negative for shortness of breath.   Cardiovascular: Negative for chest pain.  Gastrointestinal: Positive for nausea, vomiting and diarrhea. Negative for constipation.  Genitourinary: Negative for dysuria.  All other systems reviewed and are negative.     Allergies  Review of patient's allergies indicates no known allergies.  Home Medications   Prior to Admission medications   Not on File   BP 130/79 mmHg  Pulse 81  Temp(Src) 98.8 F (37.1 C) (Oral)  Resp 18  SpO2 99% Physical Exam  Constitutional: She is oriented to person, place, and time. She appears well-developed and well-nourished.  HENT:  Head: Normocephalic and atraumatic.  Eyes: Conjunctivae and EOM  are normal. Pupils are equal, round, and reactive to light.  Neck: Normal range of motion. Neck supple.  Cardiovascular: Normal rate and regular rhythm.  Exam reveals no gallop and no friction rub.   No murmur heard. Pulmonary/Chest: Effort normal and breath sounds normal. No respiratory distress. She has no wheezes. She has no rales. She exhibits no tenderness.  Abdominal: Soft. Bowel sounds are normal. She exhibits no distension and no mass. There is no tenderness. There is no rebound and no guarding.  No focal abdominal tenderness, no RLQ tenderness or pain at McBurney's point, no RUQ tenderness or Murphy's sign, no left-sided abdominal tenderness, no fluid wave, or signs of peritonitis   Musculoskeletal: Normal range of motion. She exhibits no edema or tenderness.  Neurological: She is alert and oriented to person, place, and time.  Skin: Skin is warm and dry.  Psychiatric: She has a normal mood and affect. Her behavior is normal. Judgment and thought content normal.  Nursing note and vitals reviewed.   ED Course  Procedures (including critical care time) Results for orders placed or performed during the hospital encounter of 08/01/14  CBC with Differential  Result Value Ref Range   WBC 6.2 4.0 - 10.5 K/uL   RBC 4.55 3.87 - 5.11 MIL/uL   Hemoglobin 11.9 (L) 12.0 - 15.0 g/dL   HCT 37.0 36.0 - 46.0 %   MCV 81.3 78.0 - 100.0 fL   MCH 26.2 26.0 - 34.0 pg   MCHC 32.2 30.0 - 36.0 g/dL   RDW 15.7 (H) 11.5 - 15.5 %   Platelets 341 150 -  400 K/uL   Neutrophils Relative % 76 43 - 77 %   Neutro Abs 4.7 1.7 - 7.7 K/uL   Lymphocytes Relative 18 12 - 46 %   Lymphs Abs 1.1 0.7 - 4.0 K/uL   Monocytes Relative 5 3 - 12 %   Monocytes Absolute 0.3 0.1 - 1.0 K/uL   Eosinophils Relative 1 0 - 5 %   Eosinophils Absolute 0.0 0.0 - 0.7 K/uL   Basophils Relative 0 0 - 1 %   Basophils Absolute 0.0 0.0 - 0.1 K/uL  Comprehensive metabolic panel  Result Value Ref Range   Sodium 142 135 - 145 mmol/L    Potassium 3.5 3.5 - 5.1 mmol/L   Chloride 106 101 - 111 mmol/L   CO2 26 22 - 32 mmol/L   Glucose, Bld 106 (H) 65 - 99 mg/dL   BUN 5 (L) 6 - 20 mg/dL   Creatinine, Ser 0.88 0.44 - 1.00 mg/dL   Calcium 9.9 8.9 - 10.3 mg/dL   Total Protein 7.5 6.5 - 8.1 g/dL   Albumin 4.3 3.5 - 5.0 g/dL   AST 21 15 - 41 U/L   ALT 16 14 - 54 U/L   Alkaline Phosphatase 64 38 - 126 U/L   Total Bilirubin 0.3 0.3 - 1.2 mg/dL   GFR calc non Af Amer >60 >60 mL/min   GFR calc Af Amer >60 >60 mL/min   Anion gap 10 5 - 15  Lipase, blood  Result Value Ref Range   Lipase 29 22 - 51 U/L  TSH  Result Value Ref Range   TSH 0.484 0.350 - 4.500 uIU/mL   No results found.    EKG Interpretation None      MDM   Final diagnoses:  Diarrhea  Cellulitis of breast    Patient with mild abdominal cramping and diarrhea. Symptoms have been going on for the past couple days. No focal abdominal tenderness on exam. Labs are reassuring. Patient feels improved after fluids. She has close follow-up with her primary care provider. There does appear to be a small cellulitic region without abscess on her left breast, for which I will prescribe Keflex. Patient is concerned about having thyroid storm, I do not see any evidence of this in my assessment for in her vital signs. I discussed this with Dr. Tawnya Crook, who agrees with plan. Will recommend discharged home with primary care follow-up. Patient is stable and ready for discharge. She is feeling better.  Filed Vitals:   08/01/14 1445  BP: 141/71  Pulse: 62  Temp:   Resp:       Montine Circle, PA-C 08/01/14 1521  Ernestina Patches, MD 08/02/14 1059

## 2014-08-16 ENCOUNTER — Other Ambulatory Visit: Payer: Self-pay | Admitting: Unknown Physician Specialty

## 2014-08-16 DIAGNOSIS — N6320 Unspecified lump in the left breast, unspecified quadrant: Secondary | ICD-10-CM

## 2014-08-21 ENCOUNTER — Other Ambulatory Visit: Payer: Self-pay | Admitting: Unknown Physician Specialty

## 2014-08-21 ENCOUNTER — Ambulatory Visit
Admission: RE | Admit: 2014-08-21 | Discharge: 2014-08-21 | Disposition: A | Discharge status: Home / Self Care | Payer: 59 | Source: Ambulatory Visit | Attending: Unknown Physician Specialty | Admitting: Unknown Physician Specialty

## 2014-08-21 DIAGNOSIS — N6320 Unspecified lump in the left breast, unspecified quadrant: Secondary | ICD-10-CM

## 2014-08-21 DIAGNOSIS — R921 Mammographic calcification found on diagnostic imaging of breast: Secondary | ICD-10-CM

## 2014-08-23 DIAGNOSIS — E039 Hypothyroidism, unspecified: Secondary | ICD-10-CM

## 2014-08-23 DIAGNOSIS — F32A Depression, unspecified: Secondary | ICD-10-CM

## 2014-08-23 HISTORY — DX: Depression, unspecified: F32.A

## 2014-08-23 HISTORY — DX: Hypothyroidism, unspecified: E03.9

## 2014-08-24 ENCOUNTER — Ambulatory Visit
Admission: RE | Admit: 2014-08-24 | Discharge: 2014-08-24 | Disposition: A | Discharge status: Home / Self Care | Payer: 59 | Source: Ambulatory Visit | Attending: Unknown Physician Specialty | Admitting: Unknown Physician Specialty

## 2014-08-24 ENCOUNTER — Other Ambulatory Visit: Payer: Self-pay | Admitting: Unknown Physician Specialty

## 2014-08-24 DIAGNOSIS — R921 Mammographic calcification found on diagnostic imaging of breast: Secondary | ICD-10-CM

## 2014-09-01 ENCOUNTER — Ambulatory Visit: Admission: RE | Admit: 2014-09-01 | Payer: 59 | Source: Ambulatory Visit

## 2014-12-15 DIAGNOSIS — C50919 Malignant neoplasm of unspecified site of unspecified female breast: Secondary | ICD-10-CM

## 2014-12-15 HISTORY — DX: Malignant neoplasm of unspecified site of unspecified female breast: C50.919

## 2015-01-11 DIAGNOSIS — L659 Nonscarring hair loss, unspecified: Secondary | ICD-10-CM

## 2015-01-11 HISTORY — DX: Nonscarring hair loss, unspecified: L65.9

## 2016-02-20 DIAGNOSIS — M898X9 Other specified disorders of bone, unspecified site: Secondary | ICD-10-CM

## 2016-02-20 HISTORY — DX: Other specified disorders of bone, unspecified site: M89.8X9

## 2016-02-21 DIAGNOSIS — D509 Iron deficiency anemia, unspecified: Secondary | ICD-10-CM

## 2016-02-21 HISTORY — DX: Iron deficiency anemia, unspecified: D50.9

## 2016-07-24 ENCOUNTER — Emergency Department (HOSPITAL_COMMUNITY)
Admission: EM | Admit: 2016-07-24 | Discharge: 2016-07-24 | Disposition: A | Discharge status: Home / Self Care | Payer: BLUE CROSS/BLUE SHIELD | Attending: Emergency Medicine | Admitting: Emergency Medicine

## 2016-07-24 ENCOUNTER — Encounter (HOSPITAL_COMMUNITY): Payer: Self-pay | Admitting: Emergency Medicine

## 2016-07-24 DIAGNOSIS — R002 Palpitations: Secondary | ICD-10-CM

## 2016-07-24 DIAGNOSIS — Z79899 Other long term (current) drug therapy: Secondary | ICD-10-CM | POA: Insufficient documentation

## 2016-07-24 DIAGNOSIS — J029 Acute pharyngitis, unspecified: Secondary | ICD-10-CM | POA: Insufficient documentation

## 2016-07-24 DIAGNOSIS — Z853 Personal history of malignant neoplasm of breast: Secondary | ICD-10-CM | POA: Diagnosis not present

## 2016-07-24 HISTORY — DX: Malignant neoplasm of unspecified site of unspecified female breast: C50.919

## 2016-07-24 LAB — CBC WITH DIFFERENTIAL/PLATELET
BASOS ABS: 0 10*3/uL (ref 0.0–0.1)
Basophils Relative: 0 %
Eosinophils Absolute: 0.1 10*3/uL (ref 0.0–0.7)
Eosinophils Relative: 1 %
HEMATOCRIT: 40 % (ref 36.0–46.0)
HEMOGLOBIN: 13.3 g/dL (ref 12.0–15.0)
Lymphocytes Relative: 16 %
Lymphs Abs: 1.2 10*3/uL (ref 0.7–4.0)
MCH: 30.7 pg (ref 26.0–34.0)
MCHC: 33.3 g/dL (ref 30.0–36.0)
MCV: 92.4 fL (ref 78.0–100.0)
Monocytes Absolute: 0.4 10*3/uL (ref 0.1–1.0)
Monocytes Relative: 5 %
NEUTROS ABS: 5.8 10*3/uL (ref 1.7–7.7)
NEUTROS PCT: 78 %
Platelets: 277 10*3/uL (ref 150–400)
RBC: 4.33 MIL/uL (ref 3.87–5.11)
RDW: 12.9 % (ref 11.5–15.5)
WBC: 7.4 10*3/uL (ref 4.0–10.5)

## 2016-07-24 LAB — COMPREHENSIVE METABOLIC PANEL
ALT: 14 U/L (ref 14–54)
AST: 21 U/L (ref 15–41)
Albumin: 4.6 g/dL (ref 3.5–5.0)
Alkaline Phosphatase: 72 U/L (ref 38–126)
Anion gap: 9 (ref 5–15)
BILIRUBIN TOTAL: 0.7 mg/dL (ref 0.3–1.2)
BUN: 7 mg/dL (ref 6–20)
CO2: 26 mmol/L (ref 22–32)
Calcium: 9.8 mg/dL (ref 8.9–10.3)
Chloride: 107 mmol/L (ref 101–111)
Creatinine, Ser: 0.71 mg/dL (ref 0.44–1.00)
GFR calc Af Amer: >60 mL/min (ref >60)
GLUCOSE: 115 mg/dL — AB (ref 65–99)
POTASSIUM: 3.6 mmol/L (ref 3.5–5.1)
Sodium: 142 mmol/L (ref 135–145)
TOTAL PROTEIN: 7.4 g/dL (ref 6.5–8.1)

## 2016-07-24 LAB — TSH: TSH: 1.12 u[IU]/mL (ref 0.350–4.500)

## 2016-07-24 LAB — I-STAT TROPONIN, ED: Troponin i, poc: 0 ng/mL (ref 0.00–0.08)

## 2016-07-24 MED ORDER — LORAZEPAM 0.5 MG PO TABS
0.5000 mg | ORAL_TABLET | Freq: Once | ORAL | Status: AC
  Administered 2016-07-24: 0.5 mg via ORAL
  Filled 2016-07-24: qty 1

## 2016-07-24 NOTE — Discharge Instructions (Signed)
Follow up with ENT as scheduled tomorrow. Follow up with [facility] for palpitations. Return if heart rate high and does not improve with rest and deep breathing.

## 2016-07-24 NOTE — ED Provider Notes (Signed)
[facility] DEPT Provider Note   CSN: [id number] Arrival date & time: 07/24/16  1524     History   Chief Complaint Chief Complaint  Patient presents with  . Irregular Heart Beat    HPI Laurie [name] is a 61 y.o. female.  HPI Laurie [name] is a 61 y.o. female with history of breast cancer, thyroiditis, presents to emergency department with complaint of palpitations and sore throat. Patient is currently in remission for her breast cancer. Patient states for the last 2 weeks she has had intermittent palpitations where she feels like her heart is beating very fast and "coming out of the chest." The patient is also describing a pain in her throat that is more in the left side for the last week. She states she went to see her doctor who placed her on antibiotics. Patient states that her palpitations have been getting worse, so she did miss last 3 days of her Synthroid. She states she thought the palpitations more because of thyroid problem. She states she also did not take her antibiotics today because she was afraid that this may be a reaction to the antibiotics she is on. She actually has an appointment with ear nose throat specialist in [facility] tomorrow but states she felt so bad today that she needed to come here to be seen. Patient also admits to history of anxiety and has been taking extra anxiety medicine to help with her symptoms. She denies any fever or chills. Denies any neck swelling. Denies any difficulty swallowing. No other urinary symptoms. No other complaints.    Past Medical History:  Diagnosis Date  . Breast cancer ([place])     There are no active problems to display for this patient.   Past Surgical History:  Procedure Laterality Date  . MASTECTOMY      OB History    No data available       Home Medications    Prior to Admission medications   Medication Sig Start Date End Date Taking? Authorizing Provider  bismuth subsalicylate (PEPTO BISMOL) 262  MG chewable tablet Chew 262 mg by mouth as needed for indigestion or diarrhea or loose stools.    [provider]  bismuth subsalicylate (PEPTO BISMOL) 262 MG/15ML suspension Take 15 mLs by mouth every 6 (six) hours as needed for indigestion or diarrhea or loose stools.    [provider]  cephALEXin (KEFLEX) 500 MG capsule Take 1 capsule (500 mg total) by mouth 4 (four) times daily. 08/01/14   Montine Circle, PA-C  clonazePAM (KLONOPIN) 0.5 MG tablet Take 0.5 mg by mouth at bedtime as needed. 07/05/14   [provider]  sertraline (ZOLOFT) 25 MG tablet Take 25 mg by mouth daily. 07/01/14   [provider]  SYNTHROID 100 MCG tablet Take 100 mcg by mouth daily. 07/03/14   [provider]    Family History No family history on file.  Social History Social History  Substance Use Topics  . Smoking status: Never Smoker  . Smokeless tobacco: Never Used  . Alcohol use No     Allergies   Patient has no known allergies.   Review of Systems Review of Systems  Constitutional: Negative for chills and fever.  HENT: Positive for sore throat. Negative for congestion and trouble swallowing.   Respiratory: Negative for cough, chest tightness and shortness of breath.   Cardiovascular: Positive for palpitations. Negative for chest pain and leg swelling.  Gastrointestinal: Negative for abdominal pain, diarrhea, nausea and  vomiting.  Genitourinary: Negative for dysuria, flank pain and pelvic pain.  Musculoskeletal: Negative for arthralgias, myalgias, neck pain and neck stiffness.  Skin: Negative for rash.  Neurological: Negative for dizziness, weakness and headaches.  All other systems reviewed and are negative.    Physical Exam Updated Vital Signs BP (!) 143/75 (BP Location: Right Arm)   Pulse 87   Temp 99.2 F (37.3 C) (Oral)   Resp 16   Ht 5\' 6"  (1.676 m)   Wt 56.2 kg (124 lb)   LMP  (Exact Date)   SpO2 100%   BMI 20.01 kg/m   Physical Exam    Constitutional: She is oriented to person, place, and time. She appears well-developed and well-nourished. No distress.  HENT:  Head: Normocephalic and atraumatic.  Nose: Nose normal.  Mouth/Throat: Oropharynx is clear and moist.  Normal oropharynx  Eyes: Conjunctivae are normal.  Neck: Normal range of motion. Neck supple. No tracheal deviation present. No thyromegaly present.  Cardiovascular: Normal rate, regular rhythm and normal heart sounds.   Pulmonary/Chest: Effort normal and breath sounds normal. No respiratory distress. She has no wheezes. She has no rales.  Abdominal: Soft. Bowel sounds are normal. She exhibits no distension. There is no tenderness. There is no rebound.  Musculoskeletal: She exhibits no edema.  Lymphadenopathy:    She has no cervical adenopathy.  Neurological: She is alert and oriented to person, place, and time.  Skin: Skin is warm and dry.  Psychiatric: She has a normal mood and affect. Her behavior is normal.  Appears anxious  Nursing note and vitals reviewed.    ED Treatments / Results  Labs (all labs ordered are listed, but only abnormal results are displayed) Labs Reviewed  COMPREHENSIVE METABOLIC PANEL - Abnormal; Notable for the following:       Result Value   Glucose, Bld 115 (*)    All other components within normal limits  CBC WITH DIFFERENTIAL/PLATELET  TSH  I-STAT TROPOININ, ED    EKG  EKG Interpretation  Date/Time:  Thursday Jul 24 2016 16:17:49 EDT Ventricular Rate:  84 PR Interval:  166 QRS Duration: 64 QT Interval:  336 QTC Calculation: 397 R Axis:   83 Text Interpretation:  Normal sinus rhythm Septal infarct , age undetermined ST & T wave abnormality, consider inferolateral ischemia No previous tracing Confirmed by Blanchie Dessert [phone]) on 07/24/2016 6:08:58 PM       Radiology No results found.  Procedures Procedures (including critical care time)  Medications Ordered in ED Medications  LORazepam (ATIVAN)  tablet 0.5 mg (not administered)     Initial Impression / Assessment and Plan / ED Course  I have reviewed the triage vital signs and the nursing notes.  Pertinent labs & imaging results that were available during my care of the patient were reviewed by me and considered in my medical decision making (see chart for details).    patient did emergency department with sore throat and palpitations. EKG showing normal sinus rhythm, sinus rhythm on the monitor at this time. Patient appears to be very anxious. Her oropharynx appears to be normal to me. I do not feel any tracheal deviation or thyromegaly. She is in no acute distress. We will add TSH to her labs which were already obtained by triage. Her labs are unremarkable. Will monitor.   8:56 PM Patient's TSH is 1.12 which is normal. She has been on the monitor, and has not had any arrhythmias. She feels much better after 0.5 mg of Ativan.  She is calm, not having any symptoms at this time. Daughter is in the room area and states that she did have some palpitations a few minutes ago, but states her heart rate remained in the 70s on the monitor. We discussed possibility of anxiety and panic attacks as the cause of her palpitations. She will continue her regular medications and follow-up with ear nose throat as scheduled tomorrow. Will also give her a referral to cardiology for possible Holter monitor if continues to have palpitations. Patient agreeable to the plan. She is stable for discharge home with normal vital signs other than HTN, normal labs, in no acute distress.  Vitals:   07/24/16 1830 07/24/16 1851 07/24/16 1900 07/24/16 1930  BP: (!) 189/86 (!) 166/102 (!) 168/72 (!) 164/64  Pulse: 73 77 74 70  Resp: 12 19 13 12   Temp:      TempSrc:      SpO2: 100% 100% 100% 97%  Weight:      Height:         Final Clinical Impressions(s) / ED Diagnoses   Final diagnoses:  Palpitations  Pharyngitis, unspecified etiology    New  Prescriptions New Prescriptions   No medications on file     Jeannett Senior, Hershal Coria 07/24/16 2059    Blanchie Dessert, MD 07/25/16 1826

## 2016-07-24 NOTE — ED Triage Notes (Signed)
Pt c/o rapid heart beat off and on x's 2 weeks.  Pt st's last episode this pm.   Pt also c/o blisters in her throat and currently taking antibiotics for same.  Pt has appt. With ENT doctor tomorrow but felt like she could not wait any longer.  Pt was sent here by her oncologist

## 2016-07-29 ENCOUNTER — Encounter: Payer: Self-pay | Admitting: Physician Assistant

## 2016-07-29 ENCOUNTER — Ambulatory Visit (INDEPENDENT_AMBULATORY_CARE_PROVIDER_SITE_OTHER): Payer: BLUE CROSS/BLUE SHIELD | Admitting: Physician Assistant

## 2016-07-29 VITALS — BP 138/76 | HR 68 | Ht 66.0 in | Wt 118.0 lb

## 2016-07-29 DIAGNOSIS — C50919 Malignant neoplasm of unspecified site of unspecified female breast: Secondary | ICD-10-CM

## 2016-07-29 DIAGNOSIS — R002 Palpitations: Secondary | ICD-10-CM | POA: Diagnosis not present

## 2016-07-29 DIAGNOSIS — R0602 Shortness of breath: Secondary | ICD-10-CM

## 2016-07-29 MED ORDER — PROPRANOLOL HCL 10 MG PO TABS: 10.0000 mg | ORAL_TABLET | Freq: Every day | ORAL | 1 refills | Status: DC | PRN

## 2016-07-29 NOTE — Patient Instructions (Signed)
Medication Instructions:  1. START PROPRANOLOL 10 MG DAILY AS NEEDED FOR PALPITATIONS  Labwork: NONE ORDRED  Testing/Procedures: 1. Your physician has requested that you have an echocardiogram. Echocardiography is a painless test that uses sound waves to create images of your heart. It provides your doctor with information about the size and shape of your heart and how well your heart's chambers and valves are working. This procedure takes approximately one hour. There are no restrictions for this procedure.  2. Your physician has recommended that you wear an event monitor. Event monitors are medical devices that record the heart's electrical activity. Doctors most often [place] these monitors to diagnose arrhythmias. Arrhythmias are problems with the speed or rhythm of the heartbeat. The monitor is a small, portable device. You can wear one while you do your normal daily activities. This is usually used to diagnose what is causing palpitations/syncope (passing out).    Follow-Up: DR. Curt Bears IN 8 WEEKS   Any Other Special Instructions Will Be Listed Below (If Applicable).     If you need a refill on your cardiac medications before your next appointment, please call your pharmacy.

## 2016-07-29 NOTE — Progress Notes (Signed)
Cardiology Office Note:    Date:  07/29/2016   ID:  Laurie [name], DOB 04/29/1955, MRN [id number]  PCP:  Amador Cunas, FNP  Cardiologist:  New - Dr. Allegra Lai  Oncologist: Dr. Verdell Carmine [facility])  Referring MD: Amador Cunas, FNP   Chief Complaint  Patient presents with  . Palpitations    History of Present Illness:    Laurie [name] is a 61 y.o. female with a hx of anxiety, breast CA s/p chemo/radiation/double mastectomies who is being seen today for the evaluation of palpitations at the request of Amador Cunas, [place].  The patient was seen in the ED 07/24/16 with palpitations.  The Troponin level was normal.  Her Hgb, potassium, creatinine, TSH were all normal.  She was asked to follow up with Cardiology.  Laurie [name] is here alone today. She has noted palpitations off and on in the past. She's had symptoms that occur with different medication intolerances. She had recently been placed on antibiotic for pharyngitis. She thought that maybe she was having more palpitations related to this. On the day that she went to the emergency room she had rapid heart rates that would not improve. She took Klonopin without improvement.  She felt that her heart was going quite fast. She felt lightheaded. She denies any chest pain or shortness of breath assoc with the palpitations.  She did not do any vagal maneuvers. She denies orthopnea, PND or edema. She does note some dyspnea with more extreme activities. She denies syncope.  PAD Screen 07/29/2016  Previous PAD dx? No  Previous surgical procedure? No  Pain with walking? No  Feet/toe relief with dangling? No  Painful, non-healing ulcers? No  Extremities discolored? No    Prior CV studies:   The following studies were reviewed today:  None   Past Medical History:  Diagnosis Date  . Acquired hypothyroidism 08/23/2014  . Anxiety   . Bone pain 02/20/2016  . Breast cancer ([place]) 12/15/2014   Chemotherapy/Radiation/double mastectomy; currently  on Aromasin  . Depression 08/23/2014  . GERD (gastroesophageal reflux disease)   . Hair loss 01/11/2015  . Heart murmur    history of Scarlet Fever;   . High cholesterol    occurred on chemotherapy agent >> agent since DC'd  . IBS (irritable bowel syndrome)   . Iron (Fe) deficiency anemia 02/21/2016   Tx with Iron infusions; now on oral Iron  . TMJ syndrome   . Uterine fibroid     Past Surgical History:  Procedure Laterality Date  . COLONOSCOPY    . ESOPHAGOGASTRODUODENOSCOPY    . GYNECOLOGIC CRYOSURGERY    . MASTECTOMY Bilateral   . MOUTH SURGERY     INFANCY FELL @ 18 MTHS  . PORTACATH PLACEMENT    . RECTAL ABCESS    . SHOULDER FROZEN Bilateral   . TONSILLECTOMY AND ADENOIDECTOMY      Current Medications: Current Meds  Medication Sig  . albuterol (PROVENTIL HFA;VENTOLIN HFA) 108 (90 Base) MCG/ACT inhaler Inhale into the lungs every 6 (six) hours as needed for wheezing or shortness of breath.  . clonazePAM (KLONOPIN) 0.5 MG tablet Take 0.5 mg by mouth at bedtime as needed for anxiety.   Marland Kitchen exemestane (AROMASIN) 25 MG tablet Take 25 mg by mouth daily after breakfast.  . Multiple Vitamin (MULTI-VITAMIN DAILY PO) Take 1 tablet by mouth daily.  . sertraline (ZOLOFT) 25 MG tablet Take 25 mg by mouth daily.  Marland Kitchen SYNTHROID 112 MCG tablet Take 112 mg by mouth  daily.     Allergies:   Augmentin [amoxicillin-pot clavulanate] and Amoxicillin   Social History   Social History  . Marital status: Married    Spouse name: N/A  . Number of children: N/A  . Years of education: N/A   Occupational History  . Staying at home    Social History Main Topics  . Smoking status: Former Research scientist (life sciences)  . Smokeless tobacco: Never Used     Comment: SOCIAL SMOKER  . Alcohol use No  . Drug use: No  . Sexual activity: Not Asked   Other Topics Concern  . None   Social History Narrative   Divorced   2 children   Born in [place], lived in [place], then moved back to [place]     Family Hx: The  patient's family history includes Cancer in her cousin and maternal aunt; Cancer (age of onset: 18) in her mother; Cancer - Other in her paternal uncle; Cancer - Ovarian (age of onset: 77) in her mother; Cancer - Prostate in her father; Heart attack (age of onset: 41) in her brother; Hypertension in her father.  ROS:   Please see the history of present illness.    Review of Systems  Cardiovascular: Positive for irregular heartbeat.  Psychiatric/Behavioral: The patient is nervous/anxious.    All other systems reviewed and are negative.   EKGs/Labs/Other Test Reviewed:    EKG:  EKG is   ordered today.  The ekg ordered today demonstrates NSR, HR 68, rightward axis, QTc 406 ms, inc RBBB  Recent Labs: 07/24/2016: ALT 14; BUN 7; Creatinine, Ser 0.71; Hemoglobin 13.3; Platelets 277; Potassium 3.6; Sodium 142; TSH 1.120   Recent Lipid Panel No results found for: CHOL, TRIG, HDL, CHOLHDL, LDLCALC, LDLDIRECT  Physical Exam:    VS:  BP 138/76   Pulse 68   Ht 5\' 6"  (1.676 m)   Wt 118 lb (53.5 kg)   BMI 19.05 kg/m     Wt Readings from Last 3 Encounters:  07/29/16 118 lb (53.5 kg)  07/24/16 124 lb (56.2 kg)     Physical Exam  Constitutional: She is oriented to person, place, and time. She appears well-developed and well-nourished. No distress.  HENT:  Head: Normocephalic and atraumatic.  Eyes: No scleral icterus.  Neck: Normal range of motion. No JVD present.  Cardiovascular: Normal rate, regular rhythm, S1 normal, S2 normal and normal heart sounds.   No murmur heard. Pulmonary/Chest: Effort normal and breath sounds normal. She has no wheezes. She has no rhonchi. She has no rales.  Abdominal: Soft. There is no tenderness.  Musculoskeletal: She exhibits no edema.  Neurological: She is alert and oriented to person, place, and time.  Skin: Skin is warm and dry.  Psychiatric: She has a normal mood and affect.    ASSESSMENT:    1. Palpitations   2. Shortness of breath   3. Malignant  neoplasm of female breast, unspecified estrogen receptor status, unspecified laterality, unspecified site of breast ([place])    PLAN:    In order of problems listed above:  1. Palpitations -  She presents with recurrent episodes of rapid palpitations. Recent TSH was normal.  These symptoms are of sudden onset. Her symptoms are concerning for paroxysmal supraventricular tachycardia. She does not have any evidence of preexcitation on her ECG.   -  Arrange echocardiogram   -  Arrange Event monitor  -  Rx given for Propranolol 10 mg QD prn palpitations   -  Plan follow up with  Dr. Allegra Lai after her tests are complete.    2. Shortness of breath -  She notes some symptoms of dyspnea.  Given her palpitations, we will need to assess her EF.  -  Arrange echocardiogram.   3. Malignant neoplasm of female breast, unspecified estrogen receptor status, unspecified laterality, unspecified site of breast ([place]) - Continue follow up with Oncology.   Dispo:  Return in about 8 weeks (around 09/23/2016) for Follow up after testing with Dr. Curt Bears .   Medication Adjustments/Labs and Tests Ordered: Current medicines are reviewed at length with the patient today.  Concerns regarding medicines are outlined above.  Orders/Tests:  Orders Placed This Encounter  Procedures  . Cardiac event monitor  . EKG 12-Lead  . ECHOCARDIOGRAM COMPLETE   Medication changes: Meds ordered this encounter  Medications  . propranolol (INDERAL) 10 MG tablet    Sig: Take 1 tablet (10 mg total) by mouth daily as needed (PALPITATIONS).    Dispense:  30 tablet    Refill:  1   Signed, Richardson Dopp, PA-C  07/29/2016 4:48 PM    [facility] [address], [place], [place]  [address] Phone: [phone]; Fax: [phone]

## 2016-08-08 ENCOUNTER — Ambulatory Visit (INDEPENDENT_AMBULATORY_CARE_PROVIDER_SITE_OTHER): Payer: BLUE CROSS/BLUE SHIELD

## 2016-08-08 ENCOUNTER — Ambulatory Visit (HOSPITAL_COMMUNITY): Payer: BLUE CROSS/BLUE SHIELD | Attending: Internal Medicine

## 2016-08-08 ENCOUNTER — Other Ambulatory Visit: Payer: Self-pay

## 2016-08-08 DIAGNOSIS — R002 Palpitations: Secondary | ICD-10-CM

## 2016-08-08 DIAGNOSIS — I313 Pericardial effusion (noninflammatory): Secondary | ICD-10-CM | POA: Diagnosis not present

## 2016-08-08 DIAGNOSIS — R0602 Shortness of breath: Secondary | ICD-10-CM

## 2016-08-10 ENCOUNTER — Encounter: Payer: Self-pay | Admitting: Physician Assistant

## 2016-08-11 ENCOUNTER — Telehealth: Payer: Self-pay | Admitting: *Deleted

## 2016-08-11 NOTE — Telephone Encounter (Signed)
Pt notified of echo results by phone with verbal understanding. I will fax results to PCP Dr. Idelle Leech with [facility] in [place] per pt request. Pt thanked me for my call today.

## 2016-08-11 NOTE — Telephone Encounter (Signed)
-----   Message from Liliane Shi, [place] sent at 08/10/2016  6:04 PM EDT ----- Please call the patient. The echocardiogram shows normal heart function.   Please fax a copy of this study result to her PCP:  Amador Cunas, FNP  Thanks! Richardson Dopp, PA-C    08/10/2016 6:02 PM

## 2016-08-22 ENCOUNTER — Telehealth: Payer: Self-pay | Admitting: Physician Assistant

## 2016-08-22 NOTE — Telephone Encounter (Signed)
I returned pt's call in regards to her question about Propranolol. Pt states she took Propranolol today about 1-2 hours ago though palpitations still have not fully resolved, pt wanted to know if she could take a 2nd Propranolol. I d/w this with Cecilie Kicks, NP for further advice. I advised pt per recommendations from Cecilie Kicks, NP to give the Propranolol another hour to see if it will work, if palpitations still not gone then ok to take a 2nd Propranolol. Pt has given verbal understanding to this plan of care. Pt thanked me for our help.

## 2016-08-22 NOTE — Telephone Encounter (Signed)
°  New Prob   Has some questions regarding her propranolol (INDERAL) 10 MG tablet medications. Please call.

## 2016-08-25 ENCOUNTER — Telehealth: Payer: Self-pay | Admitting: Physician Assistant

## 2016-08-25 NOTE — Telephone Encounter (Signed)
Call should be evaluated by a Triage nurse. I will route to Triage for further evaluation.

## 2016-08-25 NOTE — Telephone Encounter (Signed)
I spoke with pt who reports since Thursday she has had palpitations that require her to take Inderal twice daily. She has had problems in the past with numbness and twitching when taking certain medications.  She is concerned Inderal may be interfering with her other medications.  Today palpitations have improved some.  She feels light headed and dizzy this AM but this is better than over the weekend.  Does not check BP but checks pulse in her wrist and states it has been OK.  Will review all medications with pharmacist and will check on monitor activity.

## 2016-08-25 NOTE — Telephone Encounter (Signed)
New Message       Pt states she called Friday about her medication   Pt c/o medication issue:  1. Name of Medication:  propranolol (INDERAL) 10 MG tablet Take 1 tablet (10 mg total) by mouth daily as needed (PALPITATIONS).     2. How are you currently taking this medication (dosage and times per day)? As prescribed  3. Are you having a reaction (difficulty breathing--STAT)? Yes  She is having trouble getting out of bed, also having numbness in hands and ankles and  Feet , she is having twitching also   4. What is your medication issue?  Thinks she is having side effects from medication

## 2016-08-25 NOTE — Telephone Encounter (Signed)
Reviewed with Eino Farber, PharmD and zoloft can possibly increase effects of beta blocker.   Reviewed monitor strips with Dr. Lovena Le (office DOD). No abnormalities noted.  Per Dr. Lovena Le pt should stop Inderal.  Reduce caffeine/alcholol intake.   I spoke with pt and gave her instructions from Dr. Lovena Le.  I instructed her to activate monitor when having palpitations so rhythm could be recorded.  Pt asking about taking increased dose of Klonipin as needed.  I told pt she should only take as directed and contact primary care regarding adjustment of this medication. I told her if heart rate became very fast or she felt as if she may pass out she should go to ED for evaluation.

## 2016-09-18 ENCOUNTER — Telehealth: Payer: Self-pay | Admitting: Physician Assistant

## 2016-09-18 NOTE — Telephone Encounter (Signed)
Patient c/o Palpitations:  High priority if patient c/o lightheadedness and shortness of breath.  1. How long have you been having palpitations? Weeks worse last night   2. Are you currently experiencing lightheadedness and shortness of breath? no  3. Have you checked your BP and heart rate? (document readings) 107 146  4. Are you experiencing any other symptoms? no

## 2016-09-18 NOTE — Telephone Encounter (Signed)
Spoke with pt had a episode of fast heart rate last night is unable to take beta blocker due to taking Zoloft 100 mg also has since been to  Urgent  Care and was suggested may take  Increase dose of Clonazepam  Will forward to  Richardson Dopp [facility] for review ./cy  Monitor has not been reviewed as of yet .

## 2016-09-19 MED ORDER — DILTIAZEM HCL 30 MG PO TABS: 30.0000 mg | ORAL_TABLET | Freq: Every day | ORAL | 3 refills | Status: AC | PRN

## 2016-09-19 NOTE — Telephone Encounter (Signed)
Pt aware of recommendations and agrees with plan of care ./cy

## 2016-09-19 NOTE — Telephone Encounter (Signed)
PRN Propranolol should be ok. But, let's DC Propranolol. Send in Rx for Diltiazem 30 mg (short acting) once daily as needed for palpitations. Make sure she has follow up with Dr. Allegra Lai after her monitor is completed. Richardson Dopp, PA-C    09/19/2016 1:35 PM

## 2016-10-06 ENCOUNTER — Telehealth: Payer: Self-pay | Admitting: Physician Assistant

## 2016-10-06 NOTE — Telephone Encounter (Signed)
New Message     Pt c/o medication issue:  1. Name of Medication:  diltiazem (CARDIZEM) 30 MG tablet Take 1 tablet (30 mg total) by mouth daily as needed.     2. How are you currently taking this medication (dosage and times per day)?  1 tablet   3. Are you having a reaction (difficulty breathing--STAT)? no  4. What is your medication issue?  Heart rate with medication is 103, shaking, palpitations

## 2016-10-06 NOTE — Telephone Encounter (Signed)
Diltiazem should only be taken as needed for palpitations. Recent event monitor did not demonstrate significant arrhythmias. If she is having a lot anxiety that seems to prompt palpitations, agree she should see her PCP for eval. Keep appointment with Dr. Allegra Lai next week as planned. If HR is sustained > 100, she can come in for an ECG to review rhythm.    Richardson Dopp, PA-C    10/06/2016 4:21 PM

## 2016-10-06 NOTE — Telephone Encounter (Addendum)
Patient calling and complaining about having jitters and palpitations. Patient is wondering if her other medications are interacting with her Diltiazem. Patient stated her HR 103 and BP 128/82. Patient is wondering if she needs to have something to help with anxiety. Informed patient that the cardiologist rarely prescribe anything other than cardiac medications. Informed patient that she should call her PCP for something to help with anxiety. Will forward to [employer] PA for further advisement.

## 2016-10-06 NOTE — Telephone Encounter (Signed)
Called patient back with Richardson Dopp PA's recommendations. Patient verbalized understanding and will call her PCP.

## 2016-10-13 NOTE — Progress Notes (Signed)
Electrophysiology Office Note   Date:  10/14/2016   ID:  Laurie, [name] 05/27/1955, MRN [id number]  PCP:  Laurie Bud, MD  Cardiologist:   Primary Electrophysiologist:  Laurie [name] Laurie Leeds, MD    Chief Complaint  Patient presents with  . New Patient (Initial Visit)    Palpitations     History of Present Illness: Laurie [name] is a 61 y.o. female who is being seen today for the evaluation of palpitations at the request of Laurie [name], [place]. Presenting today for electrophysiology evaluation. History of anxiety, breast cancer status post mastectomy, palpitations. Seen in the emergency room on 07/24/16 with palpitations. In the past, has had symptoms with different medication intolerances. She was on antibiotics for pharyngitis and had more palpitations related to that. When in the emergency room, she had palpitations are not improved. She felt her heart beating quite fast and felt lightheaded.    Today, she denies symptoms of palpitations, chest pain, shortness of breath, orthopnea, PND, lower extremity edema, claudication, dizziness, presyncope, syncope, bleeding, or neurologic sequela. The patient is tolerating medications without difficulties.    Past Medical History:  Diagnosis Date  . Acquired hypothyroidism 08/23/2014  . Anxiety   . Bone pain 02/20/2016  . Breast cancer ([place]) 12/15/2014   Chemotherapy/Radiation/double mastectomy; currently on Aromasin  . Depression 08/23/2014  . GERD (gastroesophageal reflux disease)   . Hair loss 01/11/2015  . Heart murmur    history of Scarlet Fever;   . High cholesterol    occurred on chemotherapy agent >> agent since DC'd  . History of echocardiogram    Echo 6/18: vigorous LVF, EF 65-70, trivial pericardial eff  . IBS (irritable bowel syndrome)   . Iron (Fe) deficiency anemia 02/21/2016   Tx with Iron infusions; now on oral Iron  . TMJ syndrome   . Uterine fibroid    Past Surgical History:  Procedure Laterality  Date  . COLONOSCOPY    . ESOPHAGOGASTRODUODENOSCOPY    . GYNECOLOGIC CRYOSURGERY    . MASTECTOMY Bilateral   . MOUTH SURGERY     INFANCY FELL @ 18 MTHS  . PORTACATH PLACEMENT    . RECTAL ABCESS    . SHOULDER FROZEN Bilateral   . TONSILLECTOMY AND ADENOIDECTOMY       Current Outpatient Prescriptions  Medication Sig Dispense Refill  . albuterol (PROVENTIL HFA;VENTOLIN HFA) 108 (90 Base) MCG/ACT inhaler Inhale into the lungs every 6 (six) hours as needed for wheezing or shortness of breath.    . clonazePAM (KLONOPIN) 0.5 MG tablet Take 0.5 mg by mouth at bedtime as needed for anxiety.   0  . diltiazem (CARDIZEM) 30 MG tablet Take 1 tablet (30 mg total) by mouth daily as needed. 30 tablet 3  . Multiple Vitamin (MULTI-VITAMIN DAILY PO) Take 1 tablet by mouth daily.    . sertraline (ZOLOFT) 50 MG tablet Take 50 mg by mouth daily.    Marland Kitchen SYNTHROID 112 MCG tablet Take 112 mg by mouth daily.  4   No current facility-administered medications for this visit.     Allergies:   Augmentin [amoxicillin-pot clavulanate] and Amoxicillin   Social History:  The patient  reports that she has quit smoking. She has never used smokeless tobacco. She reports that she does not drink alcohol or use drugs.   Family History:  The patient's family history includes Cancer in her cousin and maternal aunt; Cancer (age of onset: 21) in her mother; Cancer - Other in her paternal  uncle; Cancer - Ovarian (age of onset: 63) in her mother; Cancer - Prostate in her father; Heart attack (age of onset: 75) in her brother; Hypertension in her father.    ROS:  Please see the history of present illness.   Otherwise, review of systems is positive for Weight change, fatigue, palpitations, anxiety, dizziness.   All other systems are reviewed and negative.    PHYSICAL EXAM: VS:  BP 124/78   Pulse 77   Ht 5\' 6"  (1.676 m)   Wt 110 lb (49.9 kg)   SpO2 97%   BMI 17.75 kg/m  , BMI Body mass index is 17.75 kg/m. GEN: Well  nourished, well developed, in no acute distress  HEENT: normal  Neck: no JVD, carotid bruits, or masses Cardiac: RRR; no murmurs, rubs, or gallops,no edema  Respiratory:  clear to auscultation bilaterally, normal work of breathing GI: soft, nontender, nondistended, + BS MS: no deformity or atrophy  Skin: warm and dry Neuro:  Strength and sensation are intact Psych: euthymic mood, full affect  EKG:  EKG is not ordered today. Personal review of the ekg ordered shows sinus rhythm, left atrial enlargement, septal Q waves  Recent Labs: 07/24/2016: ALT 14; BUN 7; Creatinine, Ser 0.71; Hemoglobin 13.3; Platelets 277; Potassium 3.6; Sodium 142; TSH 1.120    Lipid Panel  No results found for: CHOL, TRIG, HDL, CHOLHDL, VLDL, LDLCALC, LDLDIRECT   Wt Readings from Last 3 Encounters:  10/14/16 110 lb (49.9 kg)  07/29/16 118 lb (53.5 kg)  07/24/16 124 lb (56.2 kg)      Other studies Reviewed: Additional studies/ records that were reviewed today include: TTE 08/08/16  Review of the above records today demonstrates:  - Left ventricle: The cavity size was normal. Wall thickness was   normal. Systolic function was vigorous. The estimated ejection   fraction was in the range of 65% to 70%. - Pericardium, extracardiac: A trivial pericardial effusion was   identified.  30 day monitor 09/22/16 - personally reviewed HR range 50-100 BPM Sinus rhythm, sinus tachycardia, sinus bradycardia seen. No arrhythmias noted.   ASSESSMENT AND PLAN:  1.  Palpitations: 30 day monitor without evidence of tachycardia. Echo with no major abnormalities.At this time, it is unclear as to what has been causing her episode. I told her that if she wishes we could place another 30 day monitor her palpitations change in nature. She would like to continue with current management. We Donnelle Olmeda see her back as needed.  2. SOB: Well without major issue. Continue current management.    Current medicines are reviewed at length  with the patient today.   The patient does not have concerns regarding her medicines.  The following changes were made today:  none  Labs/ tests ordered today include:  No orders of the defined types were placed in this encounter.    Disposition:   FU with Hanya Guerin PRN  Signed, Jullie Arps Laurie Leeds, MD  10/14/2016 10:22 AM     [facility] [address] [place] [address] [phone] (office) [phone] (fax)

## 2016-10-14 ENCOUNTER — Encounter: Payer: Self-pay | Admitting: Cardiology

## 2016-10-14 ENCOUNTER — Ambulatory Visit (INDEPENDENT_AMBULATORY_CARE_PROVIDER_SITE_OTHER): Payer: BLUE CROSS/BLUE SHIELD | Admitting: Cardiology

## 2016-10-14 VITALS — BP 124/78 | HR 77 | Ht 66.0 in | Wt 110.0 lb

## 2016-10-14 DIAGNOSIS — R002 Palpitations: Secondary | ICD-10-CM

## 2016-10-14 NOTE — Patient Instructions (Signed)
Medication Instructions:  Your physician recommends that you continue on your current medications as directed. Please refer to the Current Medication list given to you today.  * If you need a refill on your cardiac medications before your next appointment, please call your pharmacy.   Labwork: None ordered  Testing/Procedures: None ordered  Follow-Up: No follow up is needed at this time with Dr. Camnitz.  He will see you on an as needed basis.   Thank you for choosing [facility]!!   Demetrice Combes, RN [phone]     

## 2019-02-14 ENCOUNTER — Telehealth: Payer: Self-pay | Admitting: Nutrition

## 2019-02-14 NOTE — Telephone Encounter (Signed)
Pateint can not afford the Libre ($77.00/month).  [employer] told her that they do cover the Dexcom.  Please order only the sensors and transmitter.  Thank you

## 2019-02-14 NOTE — Telephone Encounter (Signed)
Previous note was charted on the wrong pt. Record.

## 2019-02-14 NOTE — Telephone Encounter (Signed)
Arihana reports that the cost is $70.00/ month for this Freestyle sensors.  She spoke with [employer] and they told her the Dexcom is covered and will cost less.  Please call in the Dexcom G6 to her pharmacy.  She will not need the reader.  She just needs sensors and transmitter

## 2019-02-14 NOTE — Telephone Encounter (Signed)
Previous note was charted on the wrong patient.

## 2019-02-14 NOTE — Telephone Encounter (Signed)
Vaughan Basta, this is not the correct pt's chart.

## 2019-08-10 ENCOUNTER — Telehealth: Payer: Self-pay | Admitting: Nutrition

## 2019-08-10 NOTE — Telephone Encounter (Signed)
Opened in error

## 2019-09-16 ENCOUNTER — Other Ambulatory Visit: Payer: Self-pay

## 2020-02-01 ENCOUNTER — Telehealth: Payer: Self-pay | Admitting: Nutrition

## 2020-02-01 NOTE — Telephone Encounter (Signed)
Patient called saying that she went to the pharmacy to pick up her Arna Snipe they again need a prior auth.  She would also like for you to order her the Deliah Boston system before the end of the year.  It is now a pharmacy benefit and can be ordered through her pharmacy.  Her deductible has been met, so please order the PDM and the Dash [id number] days.  Thank you.

## 2020-02-01 NOTE — Telephone Encounter (Signed)
This note has been entered by error on the wrong chart, please make correction

## 2020-02-07 NOTE — Telephone Encounter (Signed)
Error. Initial note on this patient was entered on the wrong chart
